# Patient Record
Sex: Male | Born: 1965 | Race: White | Hispanic: No | Marital: Married | Smoking: Never smoker
Health system: Southern US, Community
[De-identification: ages and names within clinical notes are randomized; demographics above are authoritative.]

## PROBLEM LIST (undated history)

## (undated) DIAGNOSIS — N200 Calculus of kidney: Secondary | ICD-10-CM

## (undated) HISTORY — DX: Calculus of kidney: N20.0

---

## 2000-02-18 HISTORY — PX: HERNIA REPAIR: SHX51

## 2010-02-17 HISTORY — PX: COLONOSCOPY: SHX174

## 2011-01-24 ENCOUNTER — Ambulatory Visit: Payer: Self-pay | Admitting: General Surgery

## 2011-02-14 ENCOUNTER — Ambulatory Visit: Payer: Self-pay | Admitting: General Surgery

## 2011-02-17 LAB — PATHOLOGY REPORT

## 2015-11-23 ENCOUNTER — Encounter: Payer: Self-pay | Admitting: General Surgery

## 2015-11-28 ENCOUNTER — Encounter: Payer: Self-pay | Admitting: General Surgery

## 2015-11-28 ENCOUNTER — Ambulatory Visit (INDEPENDENT_AMBULATORY_CARE_PROVIDER_SITE_OTHER): Payer: Federal, State, Local not specified - PPO | Admitting: General Surgery

## 2015-11-28 VITALS — BP 130/80 | HR 80 | Resp 12 | Ht 70.0 in | Wt 160.0 lb

## 2015-11-28 DIAGNOSIS — K4090 Unilateral inguinal hernia, without obstruction or gangrene, not specified as recurrent: Secondary | ICD-10-CM | POA: Diagnosis not present

## 2015-11-28 DIAGNOSIS — R1032 Left lower quadrant pain: Secondary | ICD-10-CM

## 2015-11-28 NOTE — Patient Instructions (Signed)
Follow up after CT scan

## 2015-11-28 NOTE — Progress Notes (Signed)
Patient ID: Erik Yu., male   DOB: January 16, 1966, 50 y.o.   MRN: [id number]  Chief Complaint  Patient presents with  . Other    HPI Erik Yu. is a 50 y.o. male here today for a evaluation of a hernia. He states he has been hurting in the left groin for 3 weeks.He does hurt lower in the groin.  Described as a throbbing pain. He does a lot of heavy lifting at work. I have reviewed the history of present illness with the patient.  HPI  History reviewed. No pertinent past medical history.  Past Surgical History:  Procedure Laterality Date  . COLONOSCOPY  2012  . HERNIA REPAIR Right 2002   Dr Jamal Collin    Family History  Problem Relation Age of Onset  . Cancer Mother     breast/bone    Social History Social History  Substance Use Topics  . Smoking status: Never Smoker  . Smokeless tobacco: Never Used  . Alcohol use No    No Known Allergies  No current outpatient prescriptions on file.   No current facility-administered medications for this visit.     Review of Systems Review of Systems  Constitutional: Negative.   Respiratory: Negative.   Cardiovascular: Negative.     Blood pressure 130/80, pulse 80, resp. rate 12, height 5' 10"  (1.778 m), weight 160 lb (72.6 kg).  Physical Exam Physical Exam  Constitutional: He is oriented to person, place, and time. He appears well-developed and well-nourished.  HENT:  Mouth/Throat: Oropharynx is clear and moist.  Eyes: Conjunctivae are normal. No scleral icterus.  Neck: Neck supple.  Cardiovascular: Normal rate, regular rhythm and normal heart sounds.   Pulmonary/Chest: Effort normal and breath sounds normal.  Abdominal: Soft. Normal appearance and bowel sounds are normal. A hernia (Small left inguinal) is present.  Lymphadenopathy:    He has no cervical adenopathy.  Neurological: He is alert and oriented to person, place, and time.  Skin: Skin is warm and dry.  Psychiatric: His behavior is normal.     Data Reviewed Notes reviewed   Assessment    Left groin pain Small left inguinal hernia. This hernia is essentially unchanged from over a yr ago. CT may help given the pain he has noted of recent  Plan    Follow up after CT scan of abdomen.  Patient has been scheduled for a CT abdomen/pelvis with contrast at [facility] for 12-07-15 at 8 am (arrive 7:45 am). Prep: NPO 4 hours prior and pick up prep kit. Patient verbalizes understanding.      This information has been scribed by Karie Fetch RN, BSN,BC. Marland Kitchen   SANKAR,SEEPLAPUTHUR G 12/03/2015, 11:14 AM

## 2015-12-03 ENCOUNTER — Encounter: Payer: Self-pay | Admitting: General Surgery

## 2015-12-07 ENCOUNTER — Ambulatory Visit
Admission: RE | Admit: 2015-12-07 | Discharge: 2015-12-07 | Disposition: A | Discharge status: Home / Self Care | Payer: Federal, State, Local not specified - PPO | Source: Ambulatory Visit | Attending: General Surgery | Admitting: General Surgery

## 2015-12-07 DIAGNOSIS — K760 Fatty (change of) liver, not elsewhere classified: Secondary | ICD-10-CM | POA: Insufficient documentation

## 2015-12-07 DIAGNOSIS — R1032 Left lower quadrant pain: Secondary | ICD-10-CM | POA: Insufficient documentation

## 2015-12-07 MED ORDER — IOPAMIDOL (ISOVUE-300) INJECTION 61%
100.0000 mL | Freq: Once | INTRAVENOUS | Status: AC | PRN
  Administered 2015-12-07: 100 mL via INTRAVENOUS

## 2015-12-10 ENCOUNTER — Ambulatory Visit (INDEPENDENT_AMBULATORY_CARE_PROVIDER_SITE_OTHER): Payer: Federal, State, Local not specified - PPO | Admitting: General Surgery

## 2015-12-10 ENCOUNTER — Encounter: Payer: Self-pay | Admitting: General Surgery

## 2015-12-10 VITALS — BP 120/70 | HR 55 | Resp 12 | Ht 70.0 in | Wt 162.0 lb

## 2015-12-10 DIAGNOSIS — R1032 Left lower quadrant pain: Secondary | ICD-10-CM

## 2015-12-10 DIAGNOSIS — N50812 Left testicular pain: Secondary | ICD-10-CM

## 2015-12-10 NOTE — Progress Notes (Signed)
Patient ID: Erik Mooshomas R Fleisher Jr., male   DOB: 1965-05-10, 50 y.o.   MRN: [id number]  Chief Complaint  Patient presents with  . Follow-up    left groin pain  . Colonoscopy    HPI Erik Mooshomas R Rehman Jr. is a 50 y.o. male here for a follow up of left groin pain. He had a CT completed on 12/07/15.  He reports that the pain has improved, though it is still there. He has been taking Aleve twice daily.  I have reviewed the history of present illness with the patient.   HPI  History reviewed. No pertinent past medical history.  Past Surgical History:  Procedure Laterality Date  . COLONOSCOPY  2012  . HERNIA REPAIR Right 2002   Dr Evette CristalSankar    Family History  Problem Relation Age of Onset  . Cancer Mother     breast/bone    Social History Social History  Substance Use Topics  . Smoking status: Never Smoker  . Smokeless tobacco: Never Used  . Alcohol use No    No Known Allergies  No current outpatient prescriptions on file.   No current facility-administered medications for this visit.     Review of Systems Review of Systems  Constitutional: Negative.   Respiratory: Negative.   Cardiovascular: Negative.   Gastrointestinal: Negative.     Blood pressure 120/70, pulse (!) 55, resp. rate 12, height 5\' 10"  (1.778 m), weight 162 lb (73.5 kg).  Physical Exam Physical Exam Not performed today Data Reviewed CT scan reviewed with pt. Very small fat containing hernia in left groin. Also noted mild bladder wall thickening- of uncertain significance.  Assessment    The small hernia is not likely causing his pain. Pain seems more down the scrotal area than groin. May benefit with urology eval. Pt agreeable.    Plan    Patient has been scheduled for an appointment [facility] for 12-26-15 at 3:15 pm.     This has been scribed by Sinda Duaryl-Lyn M Kennedy LPN    [facility],SEEPLAPUTHUR G 12/17/2015, 1:04 PM

## 2015-12-10 NOTE — Patient Instructions (Signed)
Follow up with Urology

## 2015-12-17 ENCOUNTER — Encounter: Payer: Self-pay | Admitting: General Surgery

## 2015-12-25 ENCOUNTER — Ambulatory Visit: Payer: Self-pay

## 2015-12-26 ENCOUNTER — Encounter: Payer: Self-pay | Admitting: Urology

## 2015-12-26 ENCOUNTER — Ambulatory Visit (INDEPENDENT_AMBULATORY_CARE_PROVIDER_SITE_OTHER): Payer: Federal, State, Local not specified - PPO | Admitting: Urology

## 2015-12-26 VITALS — BP 121/76 | HR 52 | Ht 70.0 in | Wt 163.8 lb

## 2015-12-26 DIAGNOSIS — N419 Inflammatory disease of prostate, unspecified: Secondary | ICD-10-CM

## 2015-12-26 DIAGNOSIS — R300 Dysuria: Secondary | ICD-10-CM

## 2015-12-26 DIAGNOSIS — N503 Cyst of epididymis: Secondary | ICD-10-CM

## 2015-12-26 DIAGNOSIS — R350 Frequency of micturition: Secondary | ICD-10-CM | POA: Diagnosis not present

## 2015-12-26 LAB — URINALYSIS, COMPLETE
BILIRUBIN UA: NEGATIVE
Glucose, UA: NEGATIVE
KETONES UA: NEGATIVE
Leukocytes, UA: NEGATIVE
Nitrite, UA: NEGATIVE
PH UA: 5 (ref 5.0–7.5)
PROTEIN UA: NEGATIVE
RBC UA: NEGATIVE
UUROB: 0.2 mg/dL (ref 0.2–1.0)

## 2015-12-26 LAB — BLADDER SCAN AMB NON-IMAGING: SCAN RESULT: 52

## 2015-12-26 LAB — MICROSCOPIC EXAMINATION
BACTERIA UA: NONE SEEN
Epithelial Cells (non renal): NONE SEEN /hpf (ref 0–10)
WBC, UA: NONE SEEN /hpf (ref 0–?)

## 2015-12-26 MED ORDER — CIPROFLOXACIN HCL 500 MG PO TABS: 500.0000 mg | ORAL_TABLET | Freq: Two times a day (BID) | ORAL | 0 refills | Status: DC

## 2015-12-26 MED ORDER — TAMSULOSIN HCL 0.4 MG PO CAPS: 0.4000 mg | ORAL_CAPSULE | Freq: Every day | ORAL | 11 refills | Status: DC

## 2015-12-26 NOTE — Progress Notes (Signed)
12/26/2015 4:14 PM   Erik Pencehomas R Metts Jr. [birth year] [id number]  Referring provider: No referring provider defined for this encounter.  Chief Complaint  Patient presents with  . New Patient (Initial Visit)    dysuria, urinary frequency     HPI:   50 year old male referred by Dr.Sankar for further evaluation of left groin pain. He was initially seen for possible evaluation of a left inguinal hernia. He had a CT completed on 02/06/2016 which showed a very small fat-containing inguinal hernia which was not thought to be the source of his discomfort.  He does have a history of right inguinal hernia s/p repair ~ 10 years ago.  He reports that he developed left groin pain radiating to the base of his penis and his left testicle which lasted for at least 2 weeks and slowly lessened. He does still have some residual discomfort. He does have some mild dysuria at the tip of his penis when voiding but not consistently.  He also has mildly increased urinary frequency and urgency which is different from his baseline. He notes that his pain worsens with constipation and difficulty evacuating his stool. He's never previously had this issue. It has improved since starting a course of NSAIDs recommended by Dr. Evette CristalSankar.  His wife works at the [employer] and checked a urinalysis which was negative.  PVR today 52.  UA today negative.    He did have his PSA 6 months ago and recalls that it was low.     PMH: No past medical history on file.  Surgical History: Past Surgical History:  Procedure Laterality Date  . COLONOSCOPY  2012  . HERNIA REPAIR Right 2002   Dr Evette CristalSankar    Home Medications:    Medication List       Accurate as of 12/26/15  4:14 PM. Always use your most recent med list.          ciprofloxacin 500 MG tablet Commonly known as:  CIPRO Take 1 tablet (500 mg total) by mouth 2 (two) times daily.       Allergies: No Known Allergies  Family History: Family History  Problem  Relation Age of Onset  . Cancer Mother     breast/bone  . Prostate cancer Neg Hx   . Kidney cancer Neg Hx   . Sickle cell anemia Neg Hx     Social History:  reports that he has never smoked. He has never used smokeless tobacco. He reports that he does not drink alcohol or use drugs.  ROS: UROLOGY Frequent Urination?: Yes Hard to postpone urination?: No Burning/pain with urination?: Yes Get up at night to urinate?: No Leakage of urine?: No Urine stream starts and stops?: No Trouble starting stream?: No Do you have to strain to urinate?: No Blood in urine?: No Urinary tract infection?: No Sexually transmitted disease?: No Injury to kidneys or bladder?: No Painful intercourse?: No Weak stream?: No Erection problems?: No Penile pain?: Yes  Gastrointestinal Nausea?: No Vomiting?: No Indigestion/heartburn?: No Diarrhea?: No Constipation?: No  Constitutional Fever: No Night sweats?: No Weight loss?: No Fatigue?: No  Skin Skin rash/lesions?: No Itching?: No  Eyes Blurred vision?: No Double vision?: No  Ears/Nose/Throat Sore throat?: No Sinus problems?: No  Hematologic/Lymphatic Swollen glands?: No Easy bruising?: No  Cardiovascular Leg swelling?: No Chest pain?: No  Respiratory Cough?: No Shortness of breath?: No  Endocrine Excessive thirst?: No  Musculoskeletal Back pain?: No Joint pain?: No  Neurological Headaches?: No Dizziness?: No  Psychologic Depression?:  No Anxiety?: No  Physical Exam: BP 121/76   Pulse (!) 52   Ht 5\' 10"  (1.778 m)   Wt 163 lb 12.8 oz (74.3 kg)   BMI 23.50 kg/m   Constitutional:  Alert and oriented, No acute distress. HEENT: [place] AT, moist mucus membranes.  Trachea midline, no masses. Cardiovascular: No clubbing, cyanosis, or edema. Respiratory: Normal respiratory effort, no increased work of breathing. GI: Abdomen is soft, nontender, nondistended, no abdominal masses.  Slight bulge with Valsalva in the left  inguinal area. GU: Circumcised phallus with orthotopic meatus. No penile lesions. Bilateral testicles descended. 1 cm left epididymal cyst appreciated, nontender. No testicular tenderness or masses.  Rectal: Normal sphincter tone. Mildly tender 40 cc prostate, non-boggy without fluctuance, no nodules.  Each and states that rectal exam reproduces some of his discomfort is experiencing. Skin: No rashes, bruises or suspicious lesions. Lymph: No inguinal adenopathy. Neurologic: Grossly intact, no focal deficits, moving all 4 extremities. Psychiatric: Normal mood and affect.  Laboratory Data: Will fax [place] a PSA next week  Urinalysis UA today completely negative, see EPIC  Results for orders placed or performed in visit on 12/26/15  Microscopic Examination  Result Value Ref Range   WBC, UA None seen 0 - 5 /hpf   RBC, UA 0-2 0 - 2 /hpf   Epithelial Cells (non renal) None seen 0 - 10 /hpf   Bacteria, UA None seen None seen/Few  Urinalysis, Complete  Result Value Ref Range   Specific Gravity, UA >1.030 (H) 1.005 - 1.030   pH, UA 5.0 5.0 - 7.5   Color, UA Yellow Yellow   Appearance Ur Clear Clear   Leukocytes, UA Negative Negative   Protein, UA Negative Negative/Trace   Glucose, UA Negative Negative   Ketones, UA Negative Negative   RBC, UA Negative Negative   Bilirubin, UA Negative Negative   Urobilinogen, Ur 0.2 0.2 - 1.0 mg/dL   Nitrite, UA Negative Negative   Microscopic Examination See below:   Bladder Scan (Post Void Residual) in office  Result Value Ref Range   Scan Result 52      Pertinent Imaging: CLINICAL DATA:  Abdominal pain radiating to left inguinal region. Frequent urination  EXAM: CT ABDOMEN AND PELVIS WITH CONTRAST  TECHNIQUE: Multidetector CT imaging of the abdomen and pelvis was performed using the standard protocol following bolus administration of intravenous contrast. Oral contrast was also administered.  CONTRAST:  ISOVUE-300 IOPAMIDOL  (ISOVUE-300) INJECTION 61%  COMPARISON:  January 24, 2011  FINDINGS: Lower chest: Lung bases are clear.  Hepatobiliary: There is hepatic steatosis. No focal liver lesions are evident. Gallbladder wall is not appreciably thickened. There is no biliary duct dilatation.  Pancreas: There is no evident pancreatic mass or inflammatory focus.  Spleen: No splenic lesions are appreciable.  Adrenals/Urinary Tract: Adrenals appear unremarkable bilaterally. There is a cyst in the anterior mid left kidney measuring 1.5 x 1.3 cm. There is no hydronephrosis on either side. There is no renal or ureteral calculus on either side. Urinary bladder is midline with borderline thickening of the urinary bladder wall.  Stomach/Bowel: There are multiple sigmoid diverticula without demonstrable diverticulitis. There is no appreciable bowel wall or mesenteric thickening. There is no bowel obstruction. No free air or portal venous air.  Vascular/Lymphatic: There is no abdominal aortic aneurysm. Major mesenteric vessels appear patent. A small amount of atherosclerotic plaque is noted in the distal aorta. There is no adenopathy in the abdomen or pelvis.  Reproductive: Prostate and seminal  vesicles appear normal in size and contour. There is no pelvic mass or pelvic fluid collection.  Other: Appendix appears unremarkable. There is no ascites or abscess in the abdomen or pelvis. Patient has had right inguinal hernia repair. There is fat in the left inguinal ring.  Musculoskeletal: There are no blastic or lytic bone lesions. No intramuscular or abdominal wall lesion.  IMPRESSION: Borderline urinary bladder wall thickening. Question a degree of cystitis.  No renal or ureteral calculus.  No hydronephrosis.  No bowel obstruction. No bowel wall thickening. No abscess. Appendix appears normal.  Patient has had repair of right inguinal hernia. There is fat in the left inguinal  ring.  There is hepatic steatosis.  No focal liver lesions evident.   Electronically Signed   By: Bretta BangWilliam  Woodruff III M.D.   On: 12/07/2015 09:32  CT scan reviewed personally today  Assessment & Plan:   50 year old male with left inguinal discomfort radiating to the base) penis, left testicle, reproducible on palpation of the prostate today. No evidence of obvious urinary tract infection, UA negative. Adequate bladder emptying appreciated. Symptoms improving with NSAIDs not completely resolved.  I agree with Dr. Luan MooreSankar's assessment that his pain does not seem to be associated with small fat containing inguinal hernia  1. Prostatitis, unspecified prostatitis type Suspect low-grade prostatitis with ongoing chronic inflammation possibly contributing to his symptoms Continue NSAIDs as tolerated We'll prescribe a relatively short course of Cipro, and 2 weeks Black box warnings for Cipro described, avoid strenuous activity while taking this medication Patient will call us and let us know if his symptoms fail to resolve after 2 weeks, may consider extending the course for an additional 2 weeks thereafter PSA screening recommended, he will fax us his most recent PSA next week  2. Urinary frequency Likely secondary to above Return to care fails to improve with above treatment - Urinalysis, Complete - Bladder Scan (Post Void Residual) in office  3. Dysuria No evidence of active UTI, suspect secondary to prostatitis - Urinalysis, Complete - Bladder Scan (Post Void Residual) in office  4. Epididymal cyst Incidental 1 cm epididymal cyst, no further intervention or imaging recommended  Return if symptoms worsen or fail to improve.  Vanna ScotlandAshley Bonny Egger, MD  [facility] [address], [address] [place], [place] [address] [phone]

## 2016-01-08 ENCOUNTER — Telehealth: Payer: Self-pay

## 2016-01-08 DIAGNOSIS — R102 Pelvic and perineal pain: Secondary | ICD-10-CM

## 2016-01-08 MED ORDER — CIPROFLOXACIN HCL 500 MG PO TABS: 500.0000 mg | ORAL_TABLET | Freq: Two times a day (BID) | ORAL | 0 refills | Status: DC

## 2016-01-08 NOTE — Telephone Encounter (Signed)
Spoke with pt in reference to cipro. Made aware if s/s dont improve will need OV to discuss further options. Pt voiced understanding.

## 2016-01-08 NOTE — Telephone Encounter (Signed)
Yes, please give 2 more weeks of cipro.  If not improvement thereafter, needs f/u apt to discuss.    Vanna ScotlandAshley Rushawn Capshaw, MD

## 2016-01-08 NOTE — Telephone Encounter (Signed)
Pt called back stating that he is continuing to have pain and requested more abx. Read dictation. Just wanted to follow up prior to abx given.

## 2016-01-17 ENCOUNTER — Encounter: Payer: Self-pay | Admitting: *Deleted

## 2016-01-17 ENCOUNTER — Encounter: Payer: Self-pay | Admitting: General Surgery

## 2016-01-29 ENCOUNTER — Ambulatory Visit (INDEPENDENT_AMBULATORY_CARE_PROVIDER_SITE_OTHER): Payer: Federal, State, Local not specified - PPO

## 2016-01-29 VITALS — BP 147/95 | HR 73 | Ht 70.0 in | Wt 162.1 lb

## 2016-01-29 DIAGNOSIS — R300 Dysuria: Secondary | ICD-10-CM | POA: Diagnosis not present

## 2016-01-29 NOTE — Progress Notes (Signed)
Pt presented today with c/o dysuria and lower abd pain. Pt has been on cipro in the last 30 days. A clean catch specimen was obtained for u/a and cx.    Blood pressure (!) 147/95, pulse 73, height 5\' 10"  (1.778 m), weight 162 lb 1.6 oz (73.5 kg).

## 2016-01-30 ENCOUNTER — Telehealth: Payer: Self-pay

## 2016-01-30 LAB — URINALYSIS, COMPLETE
BILIRUBIN UA: NEGATIVE
GLUCOSE, UA: NEGATIVE
KETONES UA: NEGATIVE
Leukocytes, UA: NEGATIVE
NITRITE UA: NEGATIVE
UUROB: 0.2 mg/dL (ref 0.2–1.0)
pH, UA: 5 (ref 5.0–7.5)

## 2016-01-30 LAB — MICROSCOPIC EXAMINATION
Bacteria, UA: NONE SEEN
WBC UA: NONE SEEN /HPF (ref 0–?)

## 2016-01-30 NOTE — Telephone Encounter (Signed)
-----   Message from Vanna ScotlandAshley Brandon, MD sent at 01/30/2016  1:06 PM EST ----- Does not appear to be infected. He does have microscopic blood in his urine. If his symptoms fail to improve, please have him make another follow-up appointment with [facility].  Vanna ScotlandAshley Brandon, MD

## 2016-01-30 NOTE — Telephone Encounter (Signed)
Spoke with pt in reference to u/a results. Pt stated that today his symptoms have been almost unbearable. Pt elected to go ahead and make a f/u appt and will cancel if not needed.

## 2016-01-31 LAB — CULTURE, URINE COMPREHENSIVE

## 2016-03-04 ENCOUNTER — Encounter: Payer: Self-pay | Admitting: Emergency Medicine

## 2016-03-04 ENCOUNTER — Emergency Department: Payer: Federal, State, Local not specified - PPO

## 2016-03-04 ENCOUNTER — Emergency Department
Admission: EM | Admit: 2016-03-04 | Discharge: 2016-03-04 | Disposition: A | Discharge status: Home / Self Care | Payer: Federal, State, Local not specified - PPO | Attending: Emergency Medicine | Admitting: Emergency Medicine

## 2016-03-04 DIAGNOSIS — Z79899 Other long term (current) drug therapy: Secondary | ICD-10-CM | POA: Insufficient documentation

## 2016-03-04 DIAGNOSIS — N50812 Left testicular pain: Secondary | ICD-10-CM

## 2016-03-04 DIAGNOSIS — N200 Calculus of kidney: Secondary | ICD-10-CM | POA: Diagnosis not present

## 2016-03-04 DIAGNOSIS — R1032 Left lower quadrant pain: Secondary | ICD-10-CM | POA: Diagnosis present

## 2016-03-04 DIAGNOSIS — N50819 Testicular pain, unspecified: Secondary | ICD-10-CM | POA: Diagnosis not present

## 2016-03-04 LAB — COMPREHENSIVE METABOLIC PANEL
ALT: 19 U/L (ref 17–63)
ANION GAP: 10 (ref 5–15)
AST: 26 U/L (ref 15–41)
Albumin: 4.4 g/dL (ref 3.5–5.0)
Alkaline Phosphatase: 67 U/L (ref 38–126)
BUN: 27 mg/dL — ABNORMAL HIGH (ref 6–20)
CALCIUM: 9.4 mg/dL (ref 8.9–10.3)
CHLORIDE: 104 mmol/L (ref 101–111)
CO2: 27 mmol/L (ref 22–32)
CREATININE: 1.48 mg/dL — AB (ref 0.61–1.24)
GFR, EST NON AFRICAN AMERICAN: 53 mL/min — AB (ref >60)
Glucose, Bld: 174 mg/dL — ABNORMAL HIGH (ref 65–99)
Potassium: 3.5 mmol/L (ref 3.5–5.1)
SODIUM: 141 mmol/L (ref 135–145)
Total Bilirubin: 0.8 mg/dL (ref 0.3–1.2)
Total Protein: 7.4 g/dL (ref 6.5–8.1)

## 2016-03-04 LAB — CBC
HCT: 43.1 % (ref 40.0–52.0)
HEMOGLOBIN: 15.2 g/dL (ref 13.0–18.0)
MCH: 32 pg (ref 26.0–34.0)
MCHC: 35.3 g/dL (ref 32.0–36.0)
MCV: 90.7 fL (ref 80.0–100.0)
Platelets: 225 10*3/uL (ref 150–440)
RBC: 4.75 MIL/uL (ref 4.40–5.90)
RDW: 12.7 % (ref 11.5–14.5)
WBC: 12.7 10*3/uL — AB (ref 3.8–10.6)

## 2016-03-04 LAB — URINALYSIS, COMPLETE (UACMP) WITH MICROSCOPIC
BILIRUBIN URINE: NEGATIVE
Bacteria, UA: NONE SEEN
GLUCOSE, UA: 50 mg/dL — AB
Ketones, ur: 5 mg/dL — AB
LEUKOCYTES UA: NEGATIVE
Nitrite: NEGATIVE
PH: 5 (ref 5.0–8.0)
Protein, ur: NEGATIVE mg/dL
SPECIFIC GRAVITY, URINE: 1.021 (ref 1.005–1.030)
Squamous Epithelial / LPF: NONE SEEN

## 2016-03-04 LAB — LIPASE, BLOOD: LIPASE: 18 U/L (ref 11–51)

## 2016-03-04 MED ORDER — ONDANSETRON 4 MG PO TBDP: 4.0000 mg | ORAL_TABLET | Freq: Three times a day (TID) | ORAL | 0 refills | Status: DC | PRN

## 2016-03-04 MED ORDER — MORPHINE SULFATE (PF) 4 MG/ML IV SOLN: 4.0000 mg | Freq: Once | INTRAVENOUS | Status: DC

## 2016-03-04 MED ORDER — SODIUM CHLORIDE 0.9 % IV BOLUS (SEPSIS)
1000.0000 mL | Freq: Once | INTRAVENOUS | Status: AC
  Administered 2016-03-04: 1000 mL via INTRAVENOUS

## 2016-03-04 MED ORDER — ONDANSETRON HCL 4 MG/2ML IJ SOLN
INTRAMUSCULAR | Status: AC
  Administered 2016-03-04: 4 mg via INTRAVENOUS
  Filled 2016-03-04: qty 2

## 2016-03-04 MED ORDER — MORPHINE SULFATE (PF) 4 MG/ML IV SOLN
4.0000 mg | Freq: Once | INTRAVENOUS | Status: AC
  Administered 2016-03-04: 4 mg via INTRAVENOUS

## 2016-03-04 MED ORDER — ONDANSETRON HCL 4 MG/2ML IJ SOLN
4.0000 mg | Freq: Once | INTRAMUSCULAR | Status: AC
  Administered 2016-03-04: 4 mg via INTRAVENOUS

## 2016-03-04 MED ORDER — MORPHINE SULFATE (PF) 4 MG/ML IV SOLN
INTRAVENOUS | Status: AC
  Administered 2016-03-04: 4 mg via INTRAVENOUS
  Filled 2016-03-04: qty 1

## 2016-03-04 MED ORDER — ONDANSETRON HCL 4 MG/2ML IJ SOLN
INTRAMUSCULAR | Status: AC
  Filled 2016-03-04: qty 2

## 2016-03-04 MED ORDER — HYDROMORPHONE HCL 1 MG/ML IJ SOLN
INTRAMUSCULAR | Status: AC
  Filled 2016-03-04: qty 1

## 2016-03-04 MED ORDER — OXYCODONE-ACETAMINOPHEN 5-325 MG PO TABS: 1.0000 | ORAL_TABLET | ORAL | 0 refills | Status: DC | PRN

## 2016-03-04 MED ORDER — HYDROMORPHONE HCL 1 MG/ML IJ SOLN
1.0000 mg | Freq: Once | INTRAMUSCULAR | Status: AC
  Administered 2016-03-04: 1 mg via INTRAVENOUS

## 2016-03-04 NOTE — ED Provider Notes (Signed)
[facility] Emergency Department Provider Note   First MD Initiated Contact with Patient 03/04/16 0500     (approximate)  I have reviewed the triage vital signs and the nursing notes.   HISTORY  Chief Complaint Groin Pain    HPI Erik Mooshomas R Magid Jr. is a 51 y.o. male presents with acute onset of left groin/scrotal pain at 1 AM this morning accompanied by nausea. Patient said he had a history of the same in November and was diagnosed with prostatitis at that time by Dr. Apolinar JunesBrandon urologist. Patient states his current pain score is 10 out of 10. Patient denies any fever or febrile on presentation temperature 97.4.   Past medical history Prostatitis There are no active problems to display for this patient.   Past Surgical History:  Procedure Laterality Date  . COLONOSCOPY  2012  . HERNIA REPAIR Right 2002   Dr Evette CristalSankar    Prior to Admission medications   Medication Sig Start Date End Date Taking? Authorizing Provider  ciprofloxacin (CIPRO) 500 MG tablet Take 1 tablet (500 mg total) by mouth 2 (two) times daily. 01/08/16   Vanna ScotlandAshley Brandon, MD  tamsulosin (FLOMAX) 0.4 MG CAPS capsule Take 1 capsule (0.4 mg total) by mouth daily. 12/26/15   Vanna ScotlandAshley Brandon, MD    Allergies No known drug allergies  Family History  Problem Relation Age of Onset  . Cancer Mother     breast/bone  . Prostate cancer Neg Hx   . Kidney cancer Neg Hx   . Sickle cell anemia Neg Hx     Social History Social History  Substance Use Topics  . Smoking status: Never Smoker  . Smokeless tobacco: Never Used  . Alcohol use No    Review of Systems Constitutional: No fever/chills Eyes: No visual changes. ENT: No sore throat. Cardiovascular: Denies chest pain. Respiratory: Denies shortness of breath. Gastrointestinal:Positive for left groin pain.  No nausea, no vomiting.  No diarrhea.  No constipation. Genitourinary: Negative for dysuria.Positive for left testicular  pain Musculoskeletal: Negative for back pain. Skin: Negative for rash. Neurological: Negative for headaches, focal weakness or numbness.  10-point ROS otherwise negative.  ____________________________________________   PHYSICAL EXAM:  VITAL SIGNS: ED Triage Vitals  Enc Vitals Group     BP 03/04/16 0451 133/90     Pulse Rate 03/04/16 0451 (!) 56     Resp 03/04/16 0451 20     Temp 03/04/16 0451 97.4 F (36.3 C)     Temp Source 03/04/16 0451 Oral     SpO2 03/04/16 0451 100 %     Weight 03/04/16 0452 160 lb (72.6 kg)     Height 03/04/16 0452 5\' 10"  (1.778 m)     Head Circumference --      Peak Flow --      Pain Score 03/04/16 0452 10     Pain Loc --      Pain Edu? --      Excl. in GC? --     Constitutional: Alert and oriented.Apparent discomfort  Eyes: Conjunctivae are normal. PERRL. EOMI. Head: Atraumatic. Mouth/Throat: Mucous membranes are moist.  Oropharynx non-erythematous. Neck: No stridor.   Cardiovascular: Normal rate, regular rhythm. Good peripheral circulation. Grossly normal heart sounds. Respiratory: Normal respiratory effort.  No retractions. Lungs CTAB. Gastrointestinal: Soft and nontender. No distention.  Musculoskeletal: No lower extremity tenderness nor edema. No gross deformities of extremities. Neurologic:  Normal speech and language. No gross focal neurologic deficits are appreciated.  Skin:  Skin  is warm, dry and intact. No rash noted.   ____________________________________________   LABS (all labs ordered are listed, but only abnormal results are displayed)  Labs Reviewed  COMPREHENSIVE METABOLIC PANEL - Abnormal; Notable for the following:       Result Value   Glucose, Bld 174 (*)    BUN 27 (*)    Creatinine, Ser 1.48 (*)    GFR calc non Af Amer 53 (*)    All other components within normal limits  CBC - Abnormal; Notable for the following:    WBC 12.7 (*)    All other components within normal limits  LIPASE, BLOOD  URINALYSIS, COMPLETE  (UACMP) WITH MICROSCOPIC    RADIOLOGY I, Leith N Maxtyn Nuzum, personally viewed and evaluated these images (plain radiographs) as part of my medical decision making, as well as reviewing the written report by the radiologist.  US Scrotum  Result Date: 03/04/2016 CLINICAL DATA:  Left testicular pain, onset 5 hours prior. EXAM: SCROTAL ULTRASOUND DOPPLER ULTRASOUND OF THE TESTICLES TECHNIQUE: Complete ultrasound examination of the testicles, epididymis, and other scrotal structures was performed. Color and spectral Doppler ultrasound were also utilized to evaluate blood flow to the testicles. COMPARISON:  None. FINDINGS: Right testicle Measurements: 4.5 x 2.1 x 2.8 cm. No mass or microlithiasis visualized. Normal blood flow. Left testicle Measurements: 4.2 x 2.1 x 2.6 cm. No mass or microlithiasis visualized. Normal blood flow. Right epididymis:  Normal in size and appearance. Left epididymis: 2 epididymal head cysts, largest measuring 1 cm. No hyperemia. Hydrocele:  None visualized. Varicocele: Borderline bilaterally measuring 2.6 mm on the right and 2.5 mm on the left. Pulsed Doppler interrogation of both testes demonstrates normal low resistance arterial and venous waveforms bilaterally. IMPRESSION: 1. No acute abnormality.  No testicular torsion. 2. Incidental left epididymal head cysts. 3. Borderline bilateral varicoceles. Electronically Signed   By: Rubye Oaks M.D.   On: 03/04/2016 06:12   [place] Art/ven Flow Abd Pelv Doppler  Result Date: 03/04/2016 CLINICAL DATA:  Left testicular pain, onset 5 hours prior. EXAM: SCROTAL ULTRASOUND DOPPLER ULTRASOUND OF THE TESTICLES TECHNIQUE: Complete ultrasound examination of the testicles, epididymis, and other scrotal structures was performed. Color and spectral Doppler ultrasound were also utilized to evaluate blood flow to the testicles. COMPARISON:  None. FINDINGS: Right testicle Measurements: 4.5 x 2.1 x 2.8 cm. No mass or microlithiasis visualized. Normal  blood flow. Left testicle Measurements: 4.2 x 2.1 x 2.6 cm. No mass or microlithiasis visualized. Normal blood flow. Right epididymis:  Normal in size and appearance. Left epididymis: 2 epididymal head cysts, largest measuring 1 cm. No hyperemia. Hydrocele:  None visualized. Varicocele: Borderline bilaterally measuring 2.6 mm on the right and 2.5 mm on the left. Pulsed Doppler interrogation of both testes demonstrates normal low resistance arterial and venous waveforms bilaterally. IMPRESSION: 1. No acute abnormality.  No testicular torsion. 2. Incidental left epididymal head cysts. 3. Borderline bilateral varicoceles. Electronically Signed   By: Rubye Oaks M.D.   On: 03/04/2016 06:12   Ct Renal Stone Study  Result Date: 03/04/2016 CLINICAL DATA:  Left flank pain. EXAM: CT ABDOMEN AND PELVIS WITHOUT CONTRAST TECHNIQUE: Multidetector CT imaging of the abdomen and pelvis was performed following the standard protocol without IV contrast. COMPARISON:  CT 12/07/2015 FINDINGS: Lower chest: The lung bases are clear.  No pleural fluid. Hepatobiliary: No focal lesion allowing for lack contrast. Gallbladder physiologically distended, no calcified stone. No biliary dilatation. Pancreas: No ductal dilatation or inflammation. Spleen: Upper limits normal in size  measuring 12.3 cm. No focal abnormality. Adrenals/Urinary Tract: Obstructing 7 x 3 mm stone in the distal left ureter just proximal to the ureterovesicular junction with moderate hydroureteronephrosis. This stone may have been present on the prior exam, however was not causing obstruction at that time. Mild perinephric edema. No definite additional nonobstructing stones in either kidney. No right hydronephrosis. Simple cyst in the upper left kidney. Urinary bladder is minimally distended, questionable bladder wall thickening again seen. Normal adrenal glands. Stomach/Bowel: Small hiatal hernia. Stomach physiologically distended. No bowel wall thickening,  inflammation or abnormal distention. Normal appendix. Vascular/Lymphatic: No significant vascular findings are present. No enlarged abdominal or pelvic lymph nodes. Reproductive: Prostate is unremarkable. Other: Post right inguinal hernia repair. Fat containing left inguinal hernia. Tiny fat containing umbilical hernia. No free air or free fluid. Musculoskeletal: Stable degenerative change in the spine. There are no acute or suspicious osseous abnormalities. IMPRESSION: Obstructing 7 x 3 mm left distal ureteric stone with moderate left hydronephrosis andmild perinephric edema. Electronically Signed   By: Rubye Oaks M.D.   On: 03/04/2016 06:37     Procedures     INITIAL IMPRESSION / ASSESSMENT AND PLAN / ED COURSE  Pertinent labs & imaging results that were available during my care of the patient were reviewed by me and considered in my medical decision making (see chart for details).  Patient history of physical exam concerning for possible ureterolithiasis however given testicular pain concern for possible testicular torsion as such ultrasound was performed which revealed no evidence of testicular torsion. CT scan of the abdomen and pelvis revealed a 7 x 3 mm obstructing distal ureteral stone.   Clinical Course     ____________________________________________  FINAL CLINICAL IMPRESSION(S) / ED DIAGNOSES  Final diagnoses:  Pain in left testicle  Kidney stone on left side     MEDICATIONS GIVEN DURING THIS VISIT:  Medications  ondansetron (ZOFRAN) injection 4 mg (not administered)  ondansetron (ZOFRAN) 4 MG/2ML injection (not administered)  morphine 4 MG/ML injection 4 mg (4 mg Intravenous Given 03/04/16 0514)  ondansetron (ZOFRAN) injection 4 mg (4 mg Intravenous Given 03/04/16 0514)  sodium chloride 0.9 % bolus 1,000 mL (1,000 mLs Intravenous New Bag/Given 03/04/16 0514)  HYDROmorphone (DILAUDID) injection 1 mg (1 mg Intravenous Given 03/04/16 0607)     NEW OUTPATIENT  MEDICATIONS STARTED DURING THIS VISIT:  New Prescriptions   No medications on file    Modified Medications   No medications on file    Discontinued Medications   No medications on file     Note:  This document was prepared using Dragon voice recognition software and may include unintentional dictation errors.    Darci Current, MD 03/05/16 [phone]

## 2016-03-04 NOTE — ED Triage Notes (Signed)
Pt ambulatory to triage with steady gait with c/o LEFT groin pain since 1 am this morning accompanied by nasuea. Pt reports hx of same in Nov and dx with prostatitis. Pt reports has not been able to be seen by urologist. Pt appears in pain, moaning and groaning, unable to sit still in triage.

## 2016-03-05 ENCOUNTER — Ambulatory Visit: Payer: Federal, State, Local not specified - PPO | Admitting: Urology

## 2016-03-05 ENCOUNTER — Ambulatory Visit: Payer: Federal, State, Local not specified - PPO

## 2016-03-07 ENCOUNTER — Ambulatory Visit (INDEPENDENT_AMBULATORY_CARE_PROVIDER_SITE_OTHER): Payer: Federal, State, Local not specified - PPO | Admitting: Urology

## 2016-03-07 ENCOUNTER — Ambulatory Visit: Payer: Federal, State, Local not specified - PPO | Admitting: Urology

## 2016-03-07 ENCOUNTER — Ambulatory Visit
Admission: RE | Admit: 2016-03-07 | Discharge: 2016-03-07 | Disposition: A | Discharge status: Home / Self Care | Payer: Federal, State, Local not specified - PPO | Source: Ambulatory Visit | Attending: Urology | Admitting: Urology

## 2016-03-07 ENCOUNTER — Encounter: Payer: Self-pay | Admitting: Urology

## 2016-03-07 VITALS — BP 131/77 | HR 80 | Ht 70.0 in | Wt 160.0 lb

## 2016-03-07 DIAGNOSIS — N201 Calculus of ureter: Secondary | ICD-10-CM

## 2016-03-07 NOTE — Progress Notes (Signed)
03/07/2016 3:14 PM   Erik Haring Jr. [birth year] [id number]  Referring provider: No referring provider defined for this encounter.  Chief Complaint  Patient presents with  . Nephrolithiasis    follow up    HPI:   [name] who return to the office found to have a new left distal ureteral stone s/p recent ED visit.  1- Left distal ureteral stone Colicky, severe LLQ pain on night prior Thanksgiving, Christmas eve, and 03/04/16.  Found to have 7 x 3 mm left distal ureteral stone, no seen on previous contrast study with mild hydronephrosis.  WBC 12.7.  UA negative other than blood.  Cr 1.48, baseline unknown.    No previous stone episodes.    Continues to have occasional flank pain.  No fevers, chills, n/v.  No voiding symptoms.    2- History of prostatitis Initially referred by Dr.Sankar for further evaluation of left groin pain radiating to base of penis. He was initially seen for possible evaluation of a left inguinal hernia. He had a CT completed on 12/07/2015 which showed a very small fat-containing inguinal hernia which was not thought to be the source of his discomfort.   Found to have mild dysuria, urgency, frequency, tenderness on prostate exam.  Improved with cipro x 30 days.   PMH: No past medical history on file.  Surgical History: Past Surgical History:  Procedure Laterality Date  . COLONOSCOPY  2012  . HERNIA REPAIR Right 2002   Dr Jamal Collin    Home Medications:  Allergies as of 03/07/2016   No Known Allergies     Medication List       Accurate as of 03/07/16 11:59 PM. Always use your most recent med list.          ondansetron 4 MG disintegrating tablet Commonly known as:  ZOFRAN ODT Take 1 tablet (4 mg total) by mouth every 8 (eight) hours as needed for nausea or vomiting.   oxyCODONE-acetaminophen 5-325 MG tablet Commonly known as:  ROXICET Take 1 tablet by mouth every 4 (four) hours as needed for severe pain.   tamsulosin 0.4 MG Caps  capsule Commonly known as:  FLOMAX Take 1 capsule (0.4 mg total) by mouth daily.       Allergies: No Known Allergies  Family History: Family History  Problem Relation Age of Onset  . Cancer Mother     breast/bone  . Prostate cancer Neg Hx   . Kidney cancer Neg Hx   . Sickle cell anemia Neg Hx     Social History:  reports that he has never smoked. He has never used smokeless tobacco. He reports that he does not drink alcohol or use drugs.  ROS: UROLOGY Frequent Urination?: No Hard to postpone urination?: No Burning/pain with urination?: Yes Get up at night to urinate?: No Leakage of urine?: No Urine stream starts and stops?: No Trouble starting stream?: No Do you have to strain to urinate?: No Blood in urine?: No Urinary tract infection?: No Sexually transmitted disease?: No Injury to kidneys or bladder?: No Painful intercourse?: No Weak stream?: No Erection problems?: No Penile pain?: Yes  Gastrointestinal Nausea?: No Vomiting?: No Indigestion/heartburn?: No Diarrhea?: No Constipation?: No  Constitutional Fever: No Night sweats?: No Weight loss?: No Fatigue?: No  Skin Skin rash/lesions?: No Itching?: No  Eyes Blurred vision?: No Double vision?: No  Ears/Nose/Throat Sore throat?: No Sinus problems?: No  Hematologic/Lymphatic Swollen glands?: No Easy bruising?: No  Cardiovascular Leg swelling?: No Chest pain?: No  Respiratory Cough?: No Shortness of breath?: No  Endocrine Excessive thirst?: No  Musculoskeletal Back pain?: No Joint pain?: No  Neurological Headaches?: No Dizziness?: No  Psychologic Depression?: No Anxiety?: No  Physical Exam: BP 131/77   Pulse 80   Ht 5' 10"  (1.778 m)   Wt 160 lb (72.6 kg)   BMI 22.96 kg/m   Constitutional:  Alert and oriented, No acute distress. HEENT: [place] AT, moist mucus membranes.  Trachea midline, no masses. Cardiovascular: No clubbing, cyanosis, or edema. Respiratory: Normal  respiratory effort, no increased work of breathing. GI: Abdomen is soft, nontender, nondistended, no abdominal masses.   GU: No CVA tenderness. Skin: No rashes, bruises or suspicious lesions. Neurologic: Grossly intact, no focal deficits, moving all 4 extremities. Psychiatric: Normal mood and affect.  Laboratory Data: Lab Results  Component Value Date   CREATININE 1.48 (H) 03/04/2016    Urinalysis Component     Latest Ref Rng & Units 03/04/2016  Color, Urine     YELLOW YELLOW (A)  Appearance     CLEAR CLEAR (A)  Specific Gravity, Urine     1.005 - 1.030 1.021  pH     5.0 - 8.0 5.0  Glucose     NEGATIVE mg/dL 50 (A)  Hgb urine dipstick     NEGATIVE MODERATE (A)  Bilirubin Urine     NEGATIVE NEGATIVE  Ketones, ur     NEGATIVE mg/dL 5 (A)  Protein     NEGATIVE mg/dL NEGATIVE  Nitrite     NEGATIVE NEGATIVE  Leukocytes, UA     NEGATIVE NEGATIVE  RBC / HPF     0 - 5 RBC/hpf 0-5  WBC, UA     0 - 5 WBC/hpf 0-5  Bacteria, UA     NONE SEEN NONE SEEN  Squamous Epithelial / LPF     NONE SEEN NONE SEEN  Mucous      PRESENT  Hyaline Casts, UA      PRESENT    Pertinent Imaging: CLINICAL DATA:  Left flank pain.  EXAM: CT ABDOMEN AND PELVIS WITHOUT CONTRAST  TECHNIQUE: Multidetector CT imaging of the abdomen and pelvis was performed following the standard protocol without IV contrast.  COMPARISON:  CT 12/07/2015  FINDINGS: Lower chest: The lung bases are clear.  No pleural fluid.  Hepatobiliary: No focal lesion allowing for lack contrast. Gallbladder physiologically distended, no calcified stone. No biliary dilatation.  Pancreas: No ductal dilatation or inflammation.  Spleen: Upper limits normal in size measuring 12.3 cm. No focal abnormality.  Adrenals/Urinary Tract: Obstructing 7 x 3 mm stone in the distal left ureter just proximal to the ureterovesicular junction with moderate hydroureteronephrosis. This stone may have been present on the prior  exam, however was not causing obstruction at that time. Mild perinephric edema. No definite additional nonobstructing stones in either kidney. No right hydronephrosis. Simple cyst in the upper left kidney. Urinary bladder is minimally distended, questionable bladder wall thickening again seen. Normal adrenal glands.  Stomach/Bowel: Small hiatal hernia. Stomach physiologically distended. No bowel wall thickening, inflammation or abnormal distention. Normal appendix.  Vascular/Lymphatic: No significant vascular findings are present. No enlarged abdominal or pelvic lymph nodes.  Reproductive: Prostate is unremarkable.  Other: Post right inguinal hernia repair. Fat containing left inguinal hernia. Tiny fat containing umbilical hernia. No free air or free fluid.  Musculoskeletal: Stable degenerative change in the spine. There are no acute or suspicious osseous abnormalities.  IMPRESSION: Obstructing 7 x 3 mm left distal ureteric stone with moderate  left hydronephrosis andmild perinephric edema.   Electronically Signed   By: Jeb Levering M.D.   On: 03/04/2016 06:37   CT scan reviewed personally today and with the patient  Assessment & Plan:    1. Left ureteral stone We discussed various treatment options including ESWL vs. ureteroscopy, laser lithotripsy, and stent. We discussed the risks and benefits of both including bleeding, infection, damage to surrounding structures, efficacy with need for possible further intervention, and need for temporary ureteral stent.  Efficacy rates of each based on location were reviewed.  Option of MET discussed, however, given the duration of pain, suspect this stone will not pass spontaneously from at least 12/2015, unlikely.  He is most interested in ESWL.    Continue Flomax and strain urine until surgery.    KUB today, stone visible.  Warning symptoms reviewed, indications for urgent medical attention reivewed.  - DG Abd 1  View; Future   Hollice Espy, MD  [facility] [address], [place], [place] [address] [phone]  I spent 25 min with this patient of which greater than 50% was spent in counseling and coordination of care with the patient.

## 2016-03-10 ENCOUNTER — Other Ambulatory Visit: Payer: Self-pay | Admitting: Radiology

## 2016-03-10 ENCOUNTER — Telehealth: Payer: Self-pay | Admitting: Radiology

## 2016-03-10 DIAGNOSIS — N201 Calculus of ureter: Secondary | ICD-10-CM

## 2016-03-10 NOTE — Telephone Encounter (Signed)
Notified pt of arrival time of 8:30 on 03/13/16 to SDS for ESWL & [facility] [place] [facility] 03/27/16 @8 :45 with KUB prior. Questions answered regarding pre-procedural instructions given at office visit on 03/07/16. Pt voices understanding.

## 2016-03-10 NOTE — Telephone Encounter (Signed)
LMOM. Need to discuss ESWL scheduled 03/13/16.

## 2016-03-12 MED ORDER — CIPROFLOXACIN HCL 500 MG PO TABS
500.0000 mg | ORAL_TABLET | ORAL | Status: AC
  Administered 2016-03-13: 500 mg via ORAL

## 2016-03-13 ENCOUNTER — Ambulatory Visit
Admission: RE | Admit: 2016-03-13 | Discharge: 2016-03-13 | Disposition: A | Discharge status: Home / Self Care | Payer: Federal, State, Local not specified - PPO | Source: Ambulatory Visit | Attending: Urology | Admitting: Urology

## 2016-03-13 ENCOUNTER — Encounter: Payer: Self-pay | Admitting: *Deleted

## 2016-03-13 ENCOUNTER — Encounter
Admission: RE | Disposition: A | Discharge status: Home / Self Care | Payer: Self-pay | Source: Ambulatory Visit | Attending: Urology

## 2016-03-13 ENCOUNTER — Ambulatory Visit: Payer: Federal, State, Local not specified - PPO

## 2016-03-13 DIAGNOSIS — N132 Hydronephrosis with renal and ureteral calculous obstruction: Secondary | ICD-10-CM | POA: Insufficient documentation

## 2016-03-13 DIAGNOSIS — N201 Calculus of ureter: Secondary | ICD-10-CM

## 2016-03-13 DIAGNOSIS — K4090 Unilateral inguinal hernia, without obstruction or gangrene, not specified as recurrent: Secondary | ICD-10-CM | POA: Diagnosis not present

## 2016-03-13 HISTORY — PX: EXTRACORPOREAL SHOCK WAVE LITHOTRIPSY: SHX1557

## 2016-03-13 SURGERY — LITHOTRIPSY, ESWL
Anesthesia: Moderate Sedation | Laterality: Left

## 2016-03-13 MED ORDER — DIPHENHYDRAMINE HCL 25 MG PO CAPS
25.0000 mg | ORAL_CAPSULE | ORAL | Status: AC
  Administered 2016-03-13: 25 mg via ORAL

## 2016-03-13 MED ORDER — DIAZEPAM 5 MG PO TABS
10.0000 mg | ORAL_TABLET | ORAL | Status: AC
  Administered 2016-03-13: 10 mg via ORAL

## 2016-03-13 MED ORDER — OXYCODONE-ACETAMINOPHEN 5-325 MG PO TABS: 1.0000 | ORAL_TABLET | ORAL | 0 refills | Status: DC | PRN

## 2016-03-13 MED ORDER — DIAZEPAM 5 MG PO TABS
ORAL_TABLET | ORAL | Status: AC
  Administered 2016-03-13: 10 mg via ORAL
  Filled 2016-03-13: qty 2

## 2016-03-13 MED ORDER — CIPROFLOXACIN HCL 500 MG PO TABS
ORAL_TABLET | ORAL | Status: AC
  Administered 2016-03-13: 500 mg via ORAL
  Filled 2016-03-13: qty 1

## 2016-03-13 MED ORDER — DIPHENHYDRAMINE HCL 25 MG PO CAPS
ORAL_CAPSULE | ORAL | Status: AC
  Administered 2016-03-13: 25 mg via ORAL
  Filled 2016-03-13: qty 1

## 2016-03-13 MED ORDER — DEXTROSE-NACL 5-0.45 % IV SOLN
INTRAVENOUS | Status: DC
  Administered 2016-03-13: 09:00:00 via INTRAVENOUS

## 2016-03-13 NOTE — Discharge Instructions (Signed)
See [facility] discharge instructions in chart.  

## 2016-03-13 NOTE — H&P (View-Only) (Signed)
03/07/2016 3:14 PM   Erik Haring Jr. 1965/08/14 [id number]  Referring provider: No referring provider defined for this encounter.  Chief Complaint  Patient presents with  . Nephrolithiasis    follow up    HPI:   [name] who return to the office found to have a new left distal ureteral stone s/p recent ED visit.  1- Left distal ureteral stone Colicky, severe LLQ pain on night prior Thanksgiving, Christmas eve, and 03/04/16.  Found to have 7 x 3 mm left distal ureteral stone, no seen on previous contrast study with mild hydronephrosis.  WBC 12.7.  UA negative other than blood.  Cr 1.48, baseline unknown.    No previous stone episodes.    Continues to have occasional flank pain.  No fevers, chills, n/v.  No voiding symptoms.    2- History of prostatitis Initially referred by Dr.Sankar for further evaluation of left groin pain radiating to base of penis. He was initially seen for possible evaluation of a left inguinal hernia. He had a CT completed on 12/07/2015 which showed a very small fat-containing inguinal hernia which was not thought to be the source of his discomfort.   Found to have mild dysuria, urgency, frequency, tenderness on prostate exam.  Improved with cipro x 30 days.   PMH: No past medical history on file.  Surgical History: Past Surgical History:  Procedure Laterality Date  . COLONOSCOPY  2012  . HERNIA REPAIR Right 2002   Dr Jamal Collin    Home Medications:  Allergies as of 03/07/2016   No Known Allergies     Medication List       Accurate as of 03/07/16 11:59 PM. Always use your most recent med list.          ondansetron 4 MG disintegrating tablet Commonly known as:  ZOFRAN ODT Take 1 tablet (4 mg total) by mouth every 8 (eight) hours as needed for nausea or vomiting.   oxyCODONE-acetaminophen 5-325 MG tablet Commonly known as:  ROXICET Take 1 tablet by mouth every 4 (four) hours as needed for severe pain.   tamsulosin 0.4 MG Caps  capsule Commonly known as:  FLOMAX Take 1 capsule (0.4 mg total) by mouth daily.       Allergies: No Known Allergies  Family History: Family History  Problem Relation Age of Onset  . Cancer Mother     breast/bone  . Prostate cancer Neg Hx   . Kidney cancer Neg Hx   . Sickle cell anemia Neg Hx     Social History:  reports that he has never smoked. He has never used smokeless tobacco. He reports that he does not drink alcohol or use drugs.  ROS: UROLOGY Frequent Urination?: No Hard to postpone urination?: No Burning/pain with urination?: Yes Get up at night to urinate?: No Leakage of urine?: No Urine stream starts and stops?: No Trouble starting stream?: No Do you have to strain to urinate?: No Blood in urine?: No Urinary tract infection?: No Sexually transmitted disease?: No Injury to kidneys or bladder?: No Painful intercourse?: No Weak stream?: No Erection problems?: No Penile pain?: Yes  Gastrointestinal Nausea?: No Vomiting?: No Indigestion/heartburn?: No Diarrhea?: No Constipation?: No  Constitutional Fever: No Night sweats?: No Weight loss?: No Fatigue?: No  Skin Skin rash/lesions?: No Itching?: No  Eyes Blurred vision?: No Double vision?: No  Ears/Nose/Throat Sore throat?: No Sinus problems?: No  Hematologic/Lymphatic Swollen glands?: No Easy bruising?: No  Cardiovascular Leg swelling?: No Chest pain?: No  Respiratory Cough?: No Shortness of breath?: No  Endocrine Excessive thirst?: No  Musculoskeletal Back pain?: No Joint pain?: No  Neurological Headaches?: No Dizziness?: No  Psychologic Depression?: No Anxiety?: No  Physical Exam: BP 131/77   Pulse 80   Ht 5' 10"  (1.778 m)   Wt 160 lb (72.6 kg)   BMI 22.96 kg/m   Constitutional:  Alert and oriented, No acute distress. HEENT: [place] AT, moist mucus membranes.  Trachea midline, no masses. Cardiovascular: No clubbing, cyanosis, or edema. Respiratory: Normal  respiratory effort, no increased work of breathing. GI: Abdomen is soft, nontender, nondistended, no abdominal masses.   GU: No CVA tenderness. Skin: No rashes, bruises or suspicious lesions. Neurologic: Grossly intact, no focal deficits, moving all 4 extremities. Psychiatric: Normal mood and affect.  Laboratory Data: Lab Results  Component Value Date   CREATININE 1.48 (H) 03/04/2016    Urinalysis Component     Latest Ref Rng & Units 03/04/2016  Color, Urine     YELLOW YELLOW (A)  Appearance     CLEAR CLEAR (A)  Specific Gravity, Urine     1.005 - 1.030 1.021  pH     5.0 - 8.0 5.0  Glucose     NEGATIVE mg/dL 50 (A)  Hgb urine dipstick     NEGATIVE MODERATE (A)  Bilirubin Urine     NEGATIVE NEGATIVE  Ketones, ur     NEGATIVE mg/dL 5 (A)  Protein     NEGATIVE mg/dL NEGATIVE  Nitrite     NEGATIVE NEGATIVE  Leukocytes, UA     NEGATIVE NEGATIVE  RBC / HPF     0 - 5 RBC/hpf 0-5  WBC, UA     0 - 5 WBC/hpf 0-5  Bacteria, UA     NONE SEEN NONE SEEN  Squamous Epithelial / LPF     NONE SEEN NONE SEEN  Mucous      PRESENT  Hyaline Casts, UA      PRESENT    Pertinent Imaging: CLINICAL DATA:  Left flank pain.  EXAM: CT ABDOMEN AND PELVIS WITHOUT CONTRAST  TECHNIQUE: Multidetector CT imaging of the abdomen and pelvis was performed following the standard protocol without IV contrast.  COMPARISON:  CT 12/07/2015  FINDINGS: Lower chest: The lung bases are clear.  No pleural fluid.  Hepatobiliary: No focal lesion allowing for lack contrast. Gallbladder physiologically distended, no calcified stone. No biliary dilatation.  Pancreas: No ductal dilatation or inflammation.  Spleen: Upper limits normal in size measuring 12.3 cm. No focal abnormality.  Adrenals/Urinary Tract: Obstructing 7 x 3 mm stone in the distal left ureter just proximal to the ureterovesicular junction with moderate hydroureteronephrosis. This stone may have been present on the prior  exam, however was not causing obstruction at that time. Mild perinephric edema. No definite additional nonobstructing stones in either kidney. No right hydronephrosis. Simple cyst in the upper left kidney. Urinary bladder is minimally distended, questionable bladder wall thickening again seen. Normal adrenal glands.  Stomach/Bowel: Small hiatal hernia. Stomach physiologically distended. No bowel wall thickening, inflammation or abnormal distention. Normal appendix.  Vascular/Lymphatic: No significant vascular findings are present. No enlarged abdominal or pelvic lymph nodes.  Reproductive: Prostate is unremarkable.  Other: Post right inguinal hernia repair. Fat containing left inguinal hernia. Tiny fat containing umbilical hernia. No free air or free fluid.  Musculoskeletal: Stable degenerative change in the spine. There are no acute or suspicious osseous abnormalities.  IMPRESSION: Obstructing 7 x 3 mm left distal ureteric stone with moderate  left hydronephrosis andmild perinephric edema.   Electronically Signed   By: Jeb Levering M.D.   On: 03/04/2016 06:37   CT scan reviewed personally today and with the patient  Assessment & Plan:    1. Left ureteral stone We discussed various treatment options including ESWL vs. ureteroscopy, laser lithotripsy, and stent. We discussed the risks and benefits of both including bleeding, infection, damage to surrounding structures, efficacy with need for possible further intervention, and need for temporary ureteral stent.  Efficacy rates of each based on location were reviewed.  Option of MET discussed, however, given the duration of pain, suspect this stone will not pass spontaneously from at least 12/2015, unlikely.  He is most interested in ESWL.    Continue Flomax and strain urine until surgery.    KUB today, stone visible.  Warning symptoms reviewed, indications for urgent medical attention reivewed.  - DG Abd 1  View; Future   Hollice Espy, MD  [facility] [address], [place], [place] [address] [phone]  I spent 25 min with this patient of which greater than 50% was spent in counseling and coordination of care with the patient.

## 2016-03-13 NOTE — Interval H&P Note (Signed)
History and Physical Interval Note:  03/13/2016 9:00 AM  Erik Mooshomas R Laningham Jr.  has presented today for surgery, with the diagnosis of kidney stone  The various methods of treatment have been discussed with the patient and family. After consideration of risks, benefits and other options for treatment, the patient has consented to  Procedure(s): EXTRACORPOREAL SHOCK WAVE LITHOTRIPSY (ESWL) (Left) as a surgical intervention .  The patient's history has been reviewed, patient examined, no change in status, stable for surgery.  I have reviewed the patient's chart and labs.  Questions were answered to the patient's satisfaction.    RRR CTAB  Vanna ScotlandAshley Mayrin Schmuck

## 2016-03-14 ENCOUNTER — Encounter: Payer: Self-pay | Admitting: Urology

## 2016-03-27 ENCOUNTER — Ambulatory Visit
Admission: RE | Admit: 2016-03-27 | Discharge: 2016-03-27 | Disposition: A | Discharge status: Home / Self Care | Payer: Federal, State, Local not specified - PPO | Source: Ambulatory Visit | Attending: Urology | Admitting: Urology

## 2016-03-27 ENCOUNTER — Encounter: Payer: Self-pay | Admitting: Urology

## 2016-03-27 ENCOUNTER — Ambulatory Visit: Payer: Federal, State, Local not specified - PPO | Admitting: Urology

## 2016-03-27 VITALS — BP 113/67 | HR 58 | Ht 70.0 in | Wt 158.3 lb

## 2016-03-27 DIAGNOSIS — N201 Calculus of ureter: Secondary | ICD-10-CM | POA: Diagnosis present

## 2016-03-27 NOTE — Progress Notes (Signed)
03/27/2016 8:37 AM   Erik Yu. [birth year] [id number]  Referring provider: No referring provider defined for this encounter.  Chief Complaint  Patient presents with  . Follow-up    2 week post litho / kub    HPI:   51 yo WM who return to the office to review the results of his KUB after undergoing ESWL on 03/13/2016 for a 7 x 3 mm left distal ureteral stone.    1- Left distal ureteral stone Colicky, severe LLQ pain on night prior Thanksgiving, Christmas eve, and 03/04/16.  Found to have 7 x 3 mm left distal ureteral stone, no seen on previous contrast study with mild hydronephrosis.  WBC 12.7.  UA negative other than blood.  Cr 1.48, baseline unknown.    No previous stone episodes.    Underwent Left ESWL on 03/13/2016; post operative course was uneventful and as expected - stone passed on 04/21/2016  KUB taken today did not demonstrate the previous left distal stone.    No flank pain.  No fevers, chills, n/v.  No voiding symptoms.    2- History of prostatitis Initially referred by Dr.Sankar for further evaluation of left groin pain radiating to base of penis. He was initially seen for possible evaluation of a left inguinal hernia. He had a CT completed on 12/07/2015 which showed a very small fat-containing inguinal hernia which was not thought to be the source of his discomfort.   Found to have mild dysuria, urgency, frequency, tenderness on prostate exam.  Improved with cipro x 30 days.  No symptoms of prostatitis today.     PMH: Past Medical History:  Diagnosis Date  . Kidney stone     Surgical History: Past Surgical History:  Procedure Laterality Date  . COLONOSCOPY  2012  . EXTRACORPOREAL SHOCK WAVE LITHOTRIPSY Left 03/13/2016   Procedure: EXTRACORPOREAL SHOCK WAVE LITHOTRIPSY (ESWL);  Surgeon: [name], MD;  Location: [facility] ORS;  Service: Urology;  Laterality: Left;  . HERNIA REPAIR Right 2002   Dr [name]    Home Medications:  Allergies as of  03/27/2016   No Known Allergies     Medication List       Accurate as of 03/27/16  8:37 AM. Always use your most recent med list.          ondansetron 4 MG disintegrating tablet Commonly known as:  ZOFRAN ODT Take 1 tablet (4 mg total) by mouth every 8 (eight) hours as needed for nausea or vomiting.   oxyCODONE-acetaminophen 5-325 MG tablet Commonly known as:  ROXICET Take 1 tablet by mouth every 4 (four) hours as needed for severe pain.   tamsulosin 0.4 MG Caps capsule Commonly known as:  FLOMAX Take 1 capsule (0.4 mg total) by mouth daily.       Allergies: No Known Allergies  Family History: Family History  Problem Relation Age of Onset  . Cancer Mother     breast/bone  . Prostate cancer Neg Hx   . Kidney cancer Neg Hx   . Sickle cell anemia Neg Hx     Social History:  reports that he has never smoked. He has never used smokeless tobacco. He reports that he does not drink alcohol or use drugs.  ROS: UROLOGY Frequent Urination?: No Hard to postpone urination?: No Burning/pain with urination?: No Get up at night to urinate?: No Leakage of urine?: No Urine stream starts and stops?: No Trouble starting stream?: No Do you have to strain to urinate?: No Blood  in urine?: No Urinary tract infection?: No Sexually transmitted disease?: No Injury to kidneys or bladder?: No Painful intercourse?: No Weak stream?: No Erection problems?: No Penile pain?: No  Gastrointestinal Nausea?: No Vomiting?: No Indigestion/heartburn?: No Diarrhea?: No Constipation?: No  Constitutional Fever: No Night sweats?: No Weight loss?: No Fatigue?: No  Skin Skin rash/lesions?: No Itching?: No  Eyes Blurred vision?: No Double vision?: No  Ears/Nose/Throat Sore throat?: No Sinus problems?: No  Hematologic/Lymphatic Swollen glands?: No Easy bruising?: No  Cardiovascular Leg swelling?: No Chest pain?: No  Respiratory Cough?: No Shortness of breath?:  No  Endocrine Excessive thirst?: No  Musculoskeletal Back pain?: No Joint pain?: No  Neurological Headaches?: No Dizziness?: No  Psychologic Depression?: No Anxiety?: No  Physical Exam: BP 113/67   Pulse (!) 58   Ht 5\' 10"  (1.778 m)   Wt 158 lb 4.8 oz (71.8 kg)   BMI 22.71 kg/m   Constitutional:  Alert and oriented, No acute distress. HEENT: [place] AT, moist mucus membranes.  Trachea midline, no masses. Cardiovascular: No clubbing, cyanosis, or edema. Respiratory: Normal respiratory effort, no increased work of breathing. GI: Abdomen is soft, nontender, nondistended, no abdominal masses.   GU: No CVA tenderness. Skin: No rashes, bruises or suspicious lesions. Neurologic: Grossly intact, no focal deficits, moving all 4 extremities. Psychiatric: Normal mood and affect.  Laboratory Data: Lab Results  Component Value Date   CREATININE 1.48 (H) 03/04/2016    Urinalysis Component     Latest Ref Rng & Units 03/04/2016  Color, Urine     YELLOW YELLOW (A)  Appearance     CLEAR CLEAR (A)  Specific Gravity, Urine     1.005 - 1.030 1.021  pH     5.0 - 8.0 5.0  Glucose     NEGATIVE mg/dL 50 (A)  Hgb urine dipstick     NEGATIVE MODERATE (A)  Bilirubin Urine     NEGATIVE NEGATIVE  Ketones, ur     NEGATIVE mg/dL 5 (A)  Protein     NEGATIVE mg/dL NEGATIVE  Nitrite     NEGATIVE NEGATIVE  Leukocytes, UA     NEGATIVE NEGATIVE  RBC / HPF     0 - 5 RBC/hpf 0-5  WBC, UA     0 - 5 WBC/hpf 0-5  Bacteria, UA     NONE SEEN NONE SEEN  Squamous Epithelial / LPF     NONE SEEN NONE SEEN  Mucous      PRESENT  Hyaline Casts, UA      PRESENT    Pertinent Imaging: CLINICAL DATA:  Recent ureteral calculus  EXAM: ABDOMEN - 1 VIEW  COMPARISON:  March 13, 2016  FINDINGS: Previously noted distal left ureteral calculus no longer evident. There are multiple stable phleboliths throughout the pelvis. No new evident calcifications. There is moderate stool throughout  the colon. There is no bowel dilatation or air-fluid level suggesting bowel obstruction. No free air. Lung bases clear.  IMPRESSION: Previously noted distal left ureteral calculus no longer evident. Stable phleboliths throughout the pelvis. No new calcifications. Bowel gas pattern unremarkable. No bowel obstruction or free air evident.   Electronically Signed   By: Bretta BangWilliam  Woodruff III M.D.   On: 03/27/2016 08:37  KUB reviewed personally and with the patient  Assessment & Plan:    1. Left ureteral stone  - s/p ESWL - stone passed  - stone sent for analysis  - obtain RUS to ensure no hydronephrosis persistent  - RTC to review results  Zara Council, [facility] [address] [place], [place] [address] [phone]

## 2016-03-31 ENCOUNTER — Other Ambulatory Visit: Payer: Self-pay | Admitting: Urology

## 2016-04-04 ENCOUNTER — Ambulatory Visit
Admission: RE | Admit: 2016-04-04 | Discharge: 2016-04-04 | Disposition: A | Discharge status: Home / Self Care | Payer: Federal, State, Local not specified - PPO | Source: Ambulatory Visit | Attending: Urology | Admitting: Urology

## 2016-04-04 ENCOUNTER — Telehealth: Payer: Self-pay

## 2016-04-04 DIAGNOSIS — N201 Calculus of ureter: Secondary | ICD-10-CM | POA: Diagnosis present

## 2016-04-04 DIAGNOSIS — N281 Cyst of kidney, acquired: Secondary | ICD-10-CM | POA: Diagnosis not present

## 2016-04-04 NOTE — Telephone Encounter (Signed)
-----   Message from Harle BattiestShannon A McGowan, PA-C sent at 04/04/2016  8:26 AM EST ----- Please notify the patient that his RUS was normal.  His stone was calcium oxalate in composition.  We can offer him a 24 hour metabolic work up at this time.  If he chooses to do so, we can cancel his appointment and reschedule when the results are in.

## 2016-04-04 NOTE — Telephone Encounter (Signed)
Spoke with pt in reference to RUS results and litholink. Pt stated that [facility] everything at his f/u visit.

## 2016-04-17 ENCOUNTER — Ambulatory Visit: Payer: Federal, State, Local not specified - PPO | Admitting: Urology

## 2016-04-17 ENCOUNTER — Encounter: Payer: Self-pay | Admitting: Urology

## 2016-04-17 VITALS — BP 125/72 | HR 68 | Ht 70.0 in | Wt 158.9 lb

## 2016-04-17 DIAGNOSIS — N201 Calculus of ureter: Secondary | ICD-10-CM | POA: Diagnosis not present

## 2016-04-17 DIAGNOSIS — N132 Hydronephrosis with renal and ureteral calculous obstruction: Secondary | ICD-10-CM

## 2016-04-17 NOTE — Progress Notes (Signed)
04/17/2016 4:19 PM   Erik Pence Jr. [birth year] [id number]  Referring provider: No referring provider defined for this encounter.  Chief Complaint  Patient presents with  . Results    RUS and Stone Analysis    HPI: 51 yo WM who presents today to discuss his RUS results and stone analysis report.   51 yo WM underwent ESWL on 03/13/2016 for a 7 x 3 mm left distal ureteral stone.    1- Left distal ureteral stone Colicky, severe LLQ pain on night prior Thanksgiving, Christmas eve, and 03/04/16.  Found to have 7 x 3 mm left distal ureteral stone, no seen on previous contrast study with mild hydronephrosis.  WBC 12.7.  UA negative other than blood.  Cr 1.48, baseline unknown.    No previous stone episodes.    Underwent Left ESWL on 03/13/2016; post operative course was uneventful and as expected - stone passed on 04/17/16  KUB taken today did not demonstrate the previous left distal stone.    No flank pain.  No fevers, chills, n/v.  No voiding symptoms.    2- History of prostatitis Initially referred by Dr.Sankar for further evaluation of left groin pain radiating to base of penis. He was initially seen for possible evaluation of a left inguinal hernia. He had a CT completed on 12/07/2015 which showed a very small fat-containing inguinal hernia which was not thought to be the source of his discomfort.   Found to have mild dysuria, urgency, frequency, tenderness on prostate exam.  Improved with cipro x 30 days.  No symptoms of prostatitis today.     PMH: Past Medical History:  Diagnosis Date  . Kidney stone     Surgical History: Past Surgical History:  Procedure Laterality Date  . COLONOSCOPY  2012  . EXTRACORPOREAL SHOCK WAVE LITHOTRIPSY Left 03/13/2016   Procedure: EXTRACORPOREAL SHOCK WAVE LITHOTRIPSY (ESWL);  Surgeon: [name], MD;  Location: [facility] ORS;  Service: Urology;  Laterality: Left;  . HERNIA REPAIR Right 2002   Dr [name]    Home Medications:    Allergies as of 04/17/2016   No Known Allergies     Medication List       Accurate as of 04/17/16  4:19 PM. Always use your most recent med list.          ondansetron 4 MG disintegrating tablet Commonly known as:  ZOFRAN ODT Take 1 tablet (4 mg total) by mouth every 8 (eight) hours as needed for nausea or vomiting.   oxyCODONE-acetaminophen 5-325 MG tablet Commonly known as:  ROXICET Take 1 tablet by mouth every 4 (four) hours as needed for severe pain.   tamsulosin 0.4 MG Caps capsule Commonly known as:  FLOMAX Take 1 capsule (0.4 mg total) by mouth daily.       Allergies: No Known Allergies  Family History: Family History  Problem Relation Age of Onset  . Cancer Mother     breast/bone  . Prostate cancer Neg Hx   . Kidney cancer Neg Hx   . Sickle cell anemia Neg Hx   . Bladder Cancer Neg Hx     Social History:  reports that he has never smoked. He has never used smokeless tobacco. He reports that he does not drink alcohol or use drugs.  ROS: UROLOGY Frequent Urination?: No Hard to postpone urination?: No Burning/pain with urination?: No Get up at night to urinate?: No Leakage of urine?: No Urine stream starts and stops?: No Trouble starting stream?: No Do  you have to strain to urinate?: No Blood in urine?: No Urinary tract infection?: No Sexually transmitted disease?: No Injury to kidneys or bladder?: No Painful intercourse?: No Weak stream?: No Erection problems?: No Penile pain?: No  Gastrointestinal Nausea?: No Vomiting?: No Indigestion/heartburn?: No Diarrhea?: No Constipation?: No  Constitutional Fever: No Night sweats?: No Weight loss?: No Fatigue?: No  Skin Skin rash/lesions?: No Itching?: No  Eyes Blurred vision?: No Double vision?: No  Ears/Nose/Throat Sore throat?: No Sinus problems?: No  Hematologic/Lymphatic Swollen glands?: No Easy bruising?: No  Cardiovascular Leg swelling?: No Chest pain?:  No  Respiratory Cough?: No Shortness of breath?: No  Endocrine Excessive thirst?: No  Musculoskeletal Back pain?: No Joint pain?: No  Neurological Headaches?: No Dizziness?: No  Psychologic Depression?: No Anxiety?: No  Physical Exam: BP 125/72   Pulse 68   Ht 5\' 10"  (1.778 m)   Wt 158 lb 14.4 oz (72.1 kg)   BMI 22.80 kg/m   Constitutional:  Alert and oriented, No acute distress. HEENT: [place] AT, moist mucus membranes.  Trachea midline, no masses. Cardiovascular: No clubbing, cyanosis, or edema. Respiratory: Normal respiratory effort, no increased work of breathing. GI: Abdomen is soft, nontender, nondistended, no abdominal masses.   GU: No CVA tenderness. Skin: No rashes, bruises or suspicious lesions. Neurologic: Grossly intact, no focal deficits, moving all 4 extremities. Psychiatric: Normal mood and affect.  Laboratory Data: Lab Results  Component Value Date   CREATININE 1.48 (H) 03/04/2016    Urinalysis Component     Latest Ref Rng & Units 03/04/2016  Color, Urine     YELLOW YELLOW (A)  Appearance     CLEAR CLEAR (A)  Specific Gravity, Urine     1.005 - 1.030 1.021  pH     5.0 - 8.0 5.0  Glucose     NEGATIVE mg/dL 50 (A)  Hgb urine dipstick     NEGATIVE MODERATE (A)  Bilirubin Urine     NEGATIVE NEGATIVE  Ketones, ur     NEGATIVE mg/dL 5 (A)  Protein     NEGATIVE mg/dL NEGATIVE  Nitrite     NEGATIVE NEGATIVE  Leukocytes, UA     NEGATIVE NEGATIVE  RBC / HPF     0 - 5 RBC/hpf 0-5  WBC, UA     0 - 5 WBC/hpf 0-5  Bacteria, UA     NONE SEEN NONE SEEN  Squamous Epithelial / LPF     NONE SEEN NONE SEEN  Mucous      PRESENT  Hyaline Casts, UA      PRESENT    Pertinent Imaging: CLINICAL DATA:  Left ureteral stone  EXAM: RENAL / URINARY TRACT ULTRASOUND COMPLETE  COMPARISON:  CT abdomen/pelvis dated 03/04/2016  FINDINGS: Right Kidney:  Length: 9.9 cm.  No mass or hydronephrosis.  Left Kidney:  Length: 10.0 cm. 10 x 12 x 11  mm interpolar cyst. No hydronephrosis.  Bladder:2.   Within normal limits.  Bilateral bladder jets are visualized.  IMPRESSION: 12 mm interpolar left renal cyst.  No hydronephrosis.   Electronically Signed   By: Charline Bills M.D.   On: 04/04/2016 08:09  RUS reviewed personally and with the patient  Assessment & Plan:    1. Left ureteral stone  - s/p ESWL - stone passed  - calcium oxalate monohydrate in composition - encouraged to increase fluids to 10 to 12 cups daily, drink juices high in citrate and avoid sodas  - offered 24 hour urine work up -  patient deferred  - RTC prn   2. Left hydronephrosis  - resolved   Michiel CowboySHANNON Artemis Loyal, [facility]  [facility] [address], [address] [place], [place] [address] [phone]

## 2017-04-21 IMAGING — US US SCROTUM
1 series · 14 of 25 positions shown · non-contrast
Comparison: None.

CLINICAL DATA: Left testicular pain, onset 5 hours prior.

EXAM:
SCROTAL ULTRASOUND
DOPPLER ULTRASOUND OF THE TESTICLES
TECHNIQUE: Complete ultrasound examination of the testicles, epididymis, and
other scrotal structures was performed. Color and spectral Doppler
ultrasound were also utilized to evaluate blood flow to the
testicles.

[Series 1: us scrotum · 0.08mm/px · 14 of 57 slices shown]
[im 1/57]
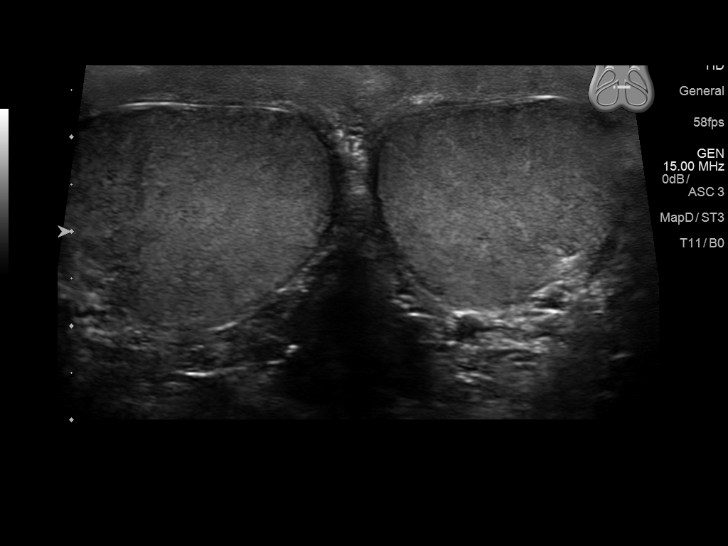
[im 5/57]
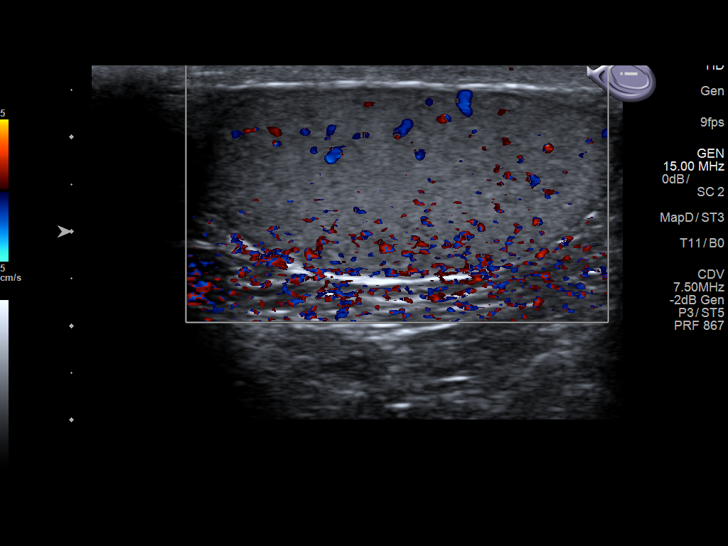
[im 10/57]
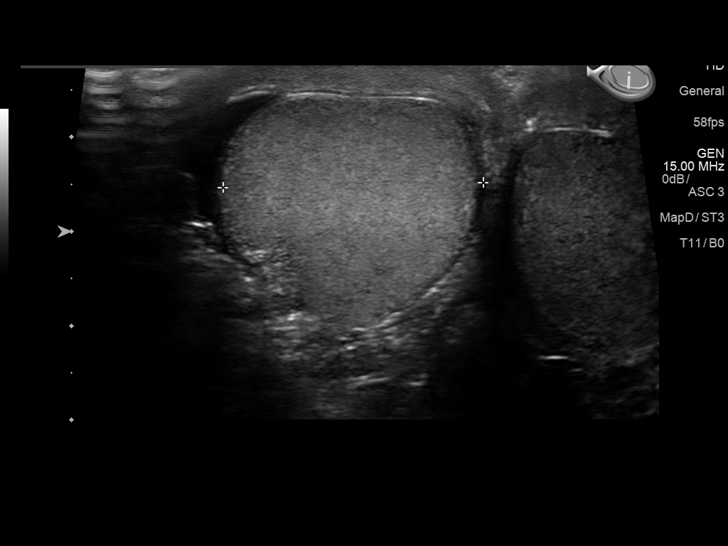
[im 15/57]
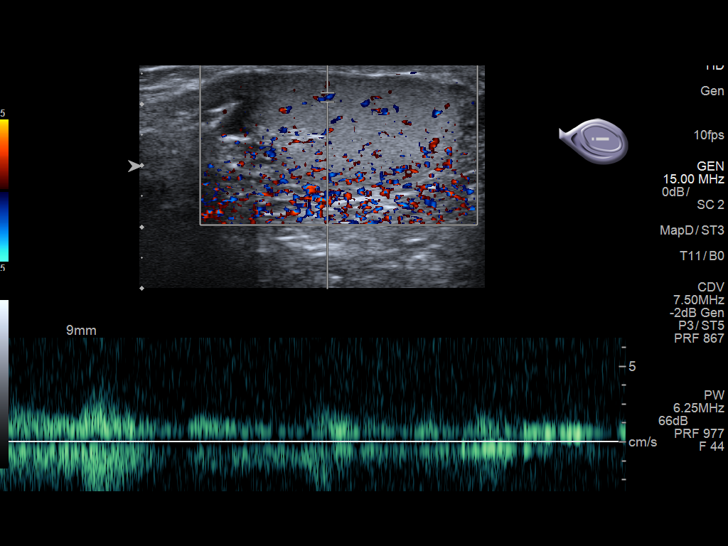
[im 19/57]
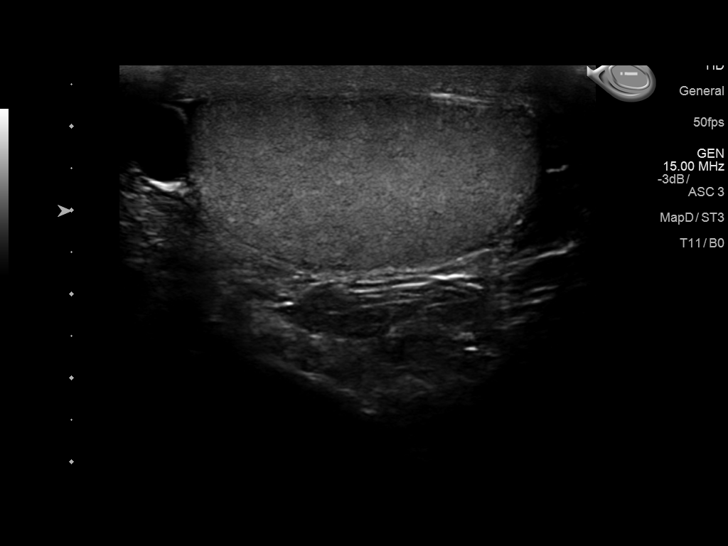
[im 22/57]
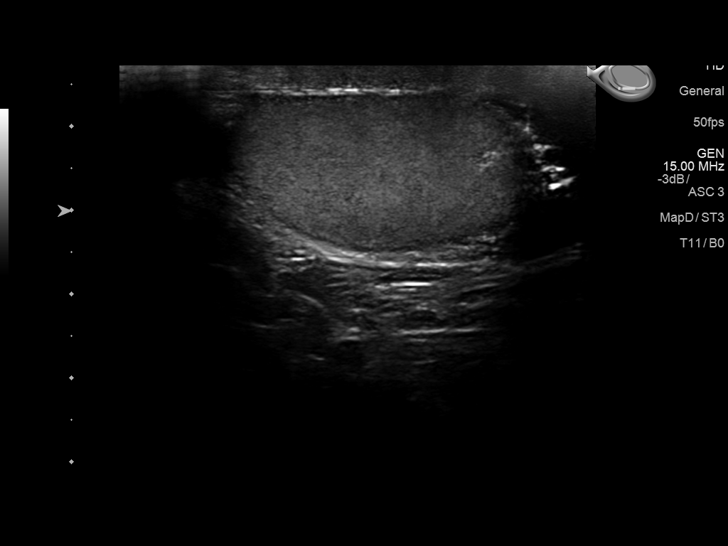
[im 26/57]
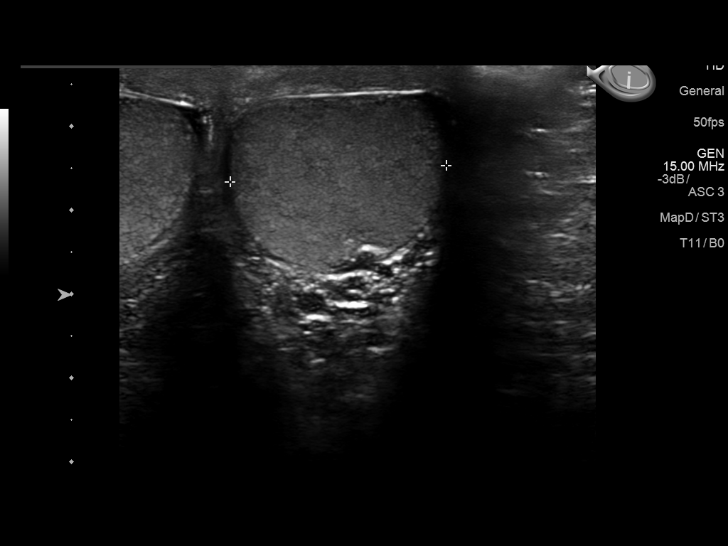
[im 31/57]
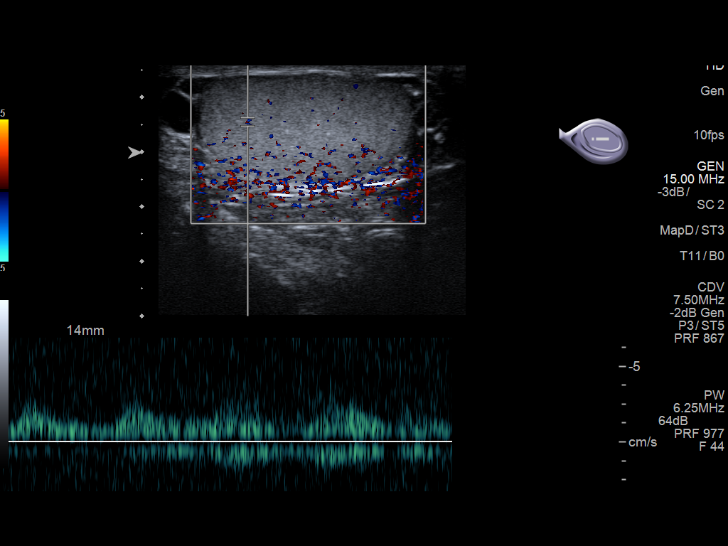
[im 36/57]
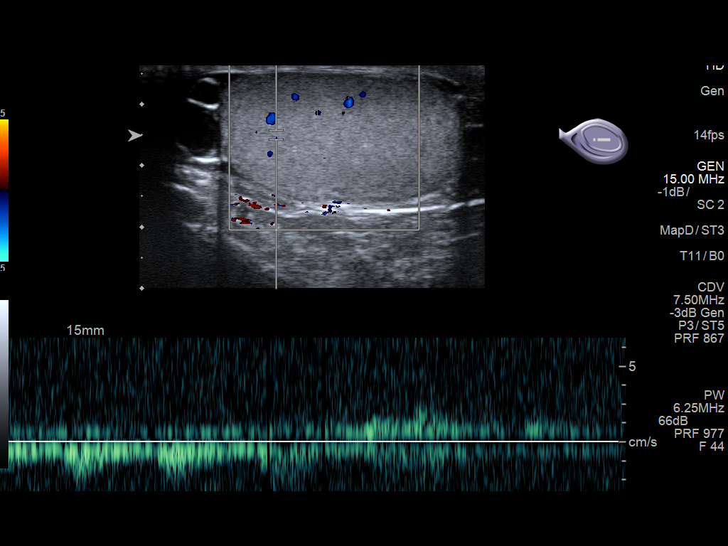
[im 38/57]
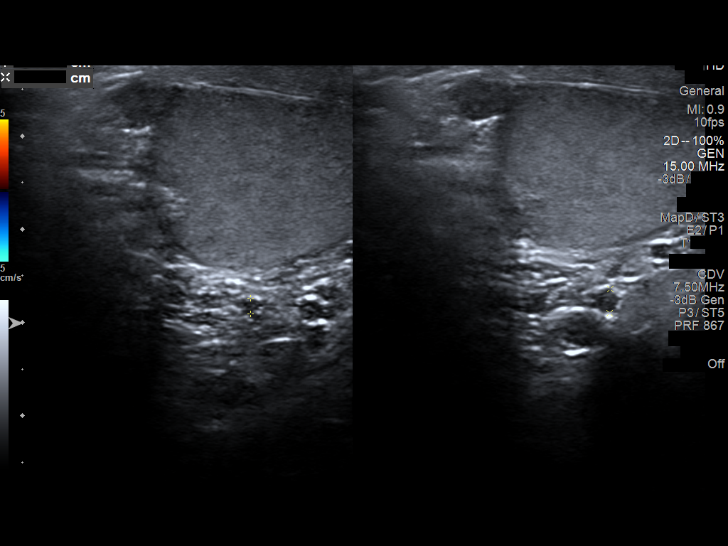
[im 43/57]
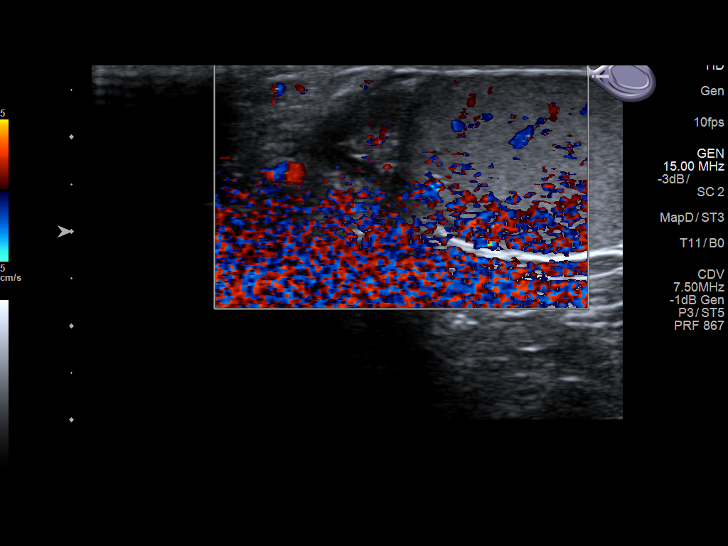
[im 47/57]
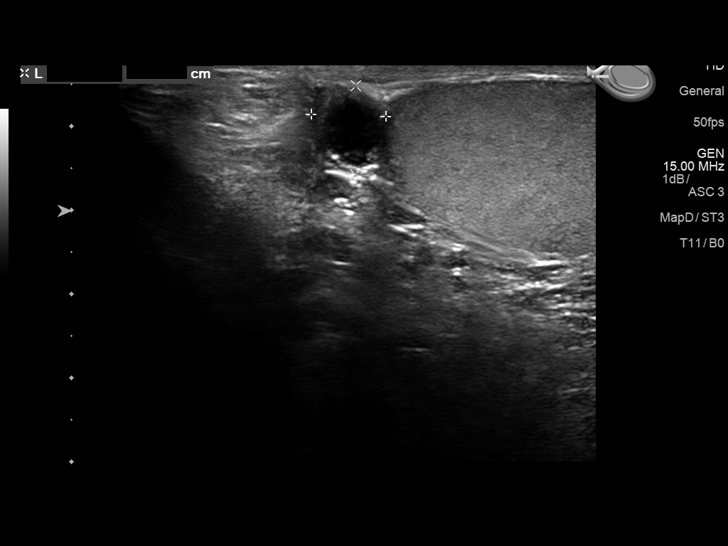
[im 52/57]
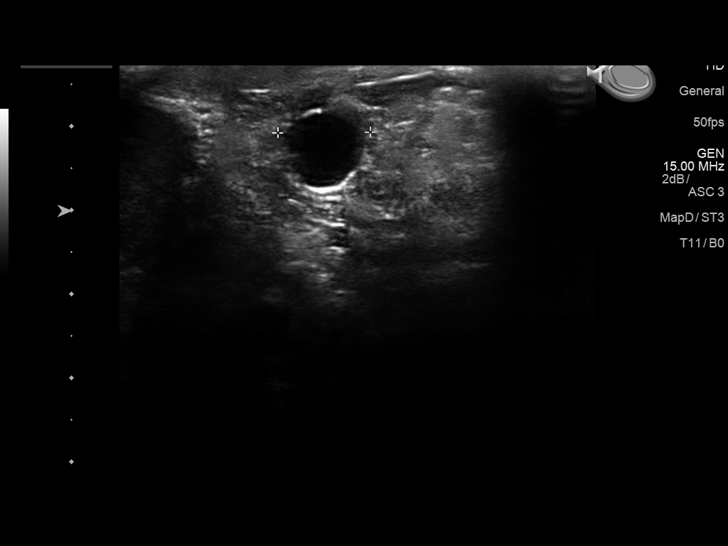
[im 57/57]
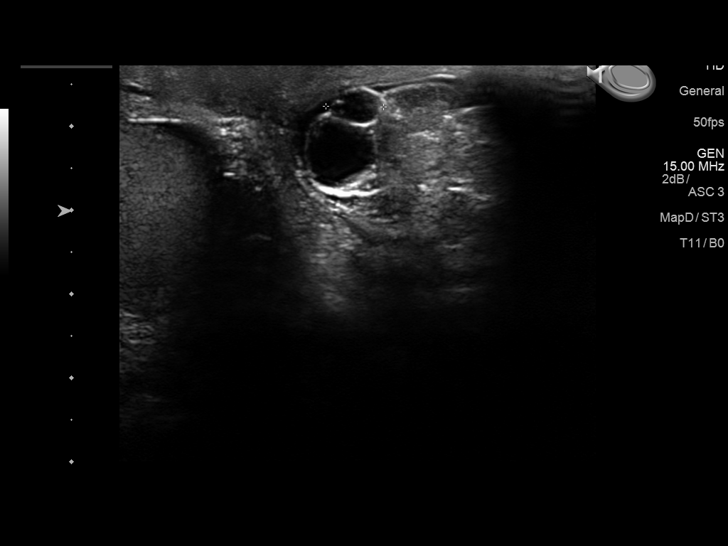

[14 of 25 positions shown; findings below may reference images not displayed]

FINDINGS: Right testicle

Measurements: 4.5 x 2.1 x 2.8 cm. No mass or microlithiasis
visualized. Normal blood flow.

Left testicle

Measurements: 4.2 x 2.1 x 2.6 cm. No mass or microlithiasis
visualized. Normal blood flow.

Right epididymis:  Normal in size and appearance.

Left epididymis: 2 epididymal head cysts, largest measuring 1 cm. No
hyperemia.

Hydrocele:  None visualized.

Varicocele: Borderline bilaterally measuring 2.6 mm on the right and
2.5 mm on the left.

Pulsed Doppler interrogation of both testes demonstrates normal low
resistance arterial and venous waveforms bilaterally.
IMPRESSION: 1. No acute abnormality.  No testicular torsion.
2. Incidental left epididymal head cysts.
3. Borderline bilateral varicoceles.

## 2022-12-04 ENCOUNTER — Ambulatory Visit: Payer: Federal, State, Local not specified - PPO | Admitting: Gastroenterology

## 2022-12-10 ENCOUNTER — Ambulatory Visit: Payer: Federal, State, Local not specified - PPO | Admitting: Gastroenterology

## 2022-12-10 ENCOUNTER — Encounter: Payer: Self-pay | Admitting: Gastroenterology

## 2022-12-10 VITALS — BP 127/82 | HR 75 | Temp 98.5°F | Ht 69.0 in | Wt 166.0 lb

## 2022-12-10 DIAGNOSIS — R1319 Other dysphagia: Secondary | ICD-10-CM

## 2022-12-10 DIAGNOSIS — R1310 Dysphagia, unspecified: Secondary | ICD-10-CM

## 2022-12-10 DIAGNOSIS — Z1211 Encounter for screening for malignant neoplasm of colon: Secondary | ICD-10-CM

## 2022-12-10 NOTE — Progress Notes (Signed)
Gastroenterology Consultation  Referring Provider:     Geanie Logan, MD Primary Care Physician:  Erik Yu, No Pcp Per Primary Gastroenterologist:  Dr. Servando Snare     Reason for Consultation:     Dysphagia        HPI:   Erik Yu. is a 57 y.o. y/o male referred for consultation & management of dysphagia by Dr. Patient, No Pcp Per.  This Erik Yu comes in today with a history of dysphagia.  The Erik Yu was seen by ENT and had reported that he was having food getting stuck when he ate.  He reported today that he sometimes has to chase it down with fluids.  He has had an occasion where the fluids have come back up.  There is no report of any unexplained weight loss fevers chills nausea vomiting black stools or bloody stools.  The Erik Yu does report that he has had heartburn in the past but it was while he was drinking juice and that was years ago.  There is no report of any other GI issues at the present time.  Past Medical History:  Diagnosis Date   Kidney stone     Past Surgical History:  Procedure Laterality Date   COLONOSCOPY  2012   EXTRACORPOREAL SHOCK WAVE LITHOTRIPSY Left 03/13/2016   Procedure: EXTRACORPOREAL SHOCK WAVE LITHOTRIPSY (ESWL);  Surgeon: [name], MD;  Location: [facility] ORS;  Service: Urology;  Laterality: Left;   HERNIA REPAIR Right 2002   Dr [name]    Prior to Admission medications   Not on File    Family History  Problem Relation Age of Onset   Cancer Mother        breast/bone   Prostate cancer Neg Hx    Kidney cancer Neg Hx    Sickle cell anemia Neg Hx    Bladder Cancer Neg Hx      Social History   Tobacco Use   Smoking status: Never   Smokeless tobacco: Never  Substance Use Topics   Alcohol use: No   Drug use: No    Allergies as of 12/10/2022   (No Known Allergies)    Review of Systems:    All systems reviewed and negative except where noted in HPI.   Physical Exam:  BP 127/82 (BP Location: Left Arm, Erik Yu Position:  Sitting, Cuff Size: Normal)   Pulse 75   Temp 98.5 F (36.9 C) (Oral)   Ht 5\' 9"  (1.753 m)   Wt 166 lb (75.3 kg)   BMI 24.51 kg/m  No LMP for male Erik Yu. General:   Alert,  Well-developed, well-nourished, pleasant and cooperative in NAD Head:  Normocephalic and atraumatic. Eyes:  Sclera clear, no icterus.   Conjunctiva pink. Ears:  Normal auditory acuity. Neck:  Supple; no masses or thyromegaly. Lungs:  Respirations even and unlabored.  Clear throughout to auscultation.   No wheezes, crackles, or rhonchi. No acute distress. Heart:  Regular rate and rhythm; no murmurs, clicks, rubs, or gallops. Abdomen:  Normal bowel sounds.  No bruits.  Soft, non-tender and non-distended without masses, hepatosplenomegaly or hernias noted.  No guarding or rebound tenderness.  Negative Carnett sign.   Rectal:  Deferred.  Pulses:  Normal pulses noted. Extremities:  No clubbing or edema.  No cyanosis. Neurologic:  Alert and oriented x3;  grossly normal neurologically. Skin:  Intact without significant lesions or rashes.  No jaundice. Lymph Nodes:  No significant cervical adenopathy. Psych:  Alert and cooperative. Normal mood and affect.  Imaging Studies: No results found.  Assessment and Plan:   Erik Yu. is a 57 y.o. y/o male who comes in with what sounds like an esophageal stricture.  The Erik Yu will be set up for an EGD with possible dilation.  The Erik Yu has also been told to avoid trying to chase the food down with liquids due to the risk of aspiration.  After the Erik Yu had left it became apparent that the Erik Yu has also not had a colonoscopy on record since 2012.  The Erik Yu will be contacted and offered a screening colonoscopy at the time of the upper endoscopy.  The Erik Yu has been explained the plan agrees with it.    [name], MD. Clementeen Graham    Note: This dictation was prepared with Dragon dictation along with smaller phrase technology. Any transcriptional errors that  result from this process are unintentional.

## 2022-12-24 ENCOUNTER — Encounter: Payer: Self-pay | Admitting: Gastroenterology

## 2022-12-30 MED ORDER — NA SULFATE-K SULFATE-MG SULF 17.5-3.13-1.6 GM/177ML PO SOLN: 1.0000 | Freq: Once | ORAL | 0 refills | Status: AC

## 2022-12-30 NOTE — Anesthesia Preprocedure Evaluation (Addendum)
Anesthesia Evaluation  Patient identified by MRN, date of birth, ID band Patient awake    Reviewed: Allergy & Precautions, H&P , NPO status , Patient's Chart, lab work & pertinent test results  Airway Mallampati: I  TM Distance: >3 FB Neck ROM: Full    Dental no notable dental hx. (+) Caps   Pulmonary neg pulmonary ROS   Pulmonary exam normal breath sounds clear to auscultation       Cardiovascular negative cardio ROS Normal cardiovascular exam Rhythm:Regular Rate:Normal     Neuro/Psych negative neurological ROS  negative psych ROS   GI/Hepatic negative GI ROS, Neg liver ROS,,,  Endo/Other  negative endocrine ROS    Renal/GU Renal diseasenegative Renal ROS  negative genitourinary   Musculoskeletal negative musculoskeletal ROS (+)    Abdominal   Peds negative pediatric ROS (+)  Hematology negative hematology ROS (+)   Anesthesia Other Findings Hx renal calculus  Reproductive/Obstetrics negative OB ROS                              Anesthesia Physical Anesthesia Plan  ASA: 1  Anesthesia Plan: General   Post-op Pain Management:    Induction: Intravenous  PONV Risk Score and Plan:   Airway Management Planned: Natural Airway and Nasal Cannula  Additional Equipment:   Intra-op Plan:   Post-operative Plan:   Informed Consent: I have reviewed the patients History and Physical, chart, labs and discussed the procedure including the risks, benefits and alternatives for the proposed anesthesia with the patient or authorized representative who has indicated his/her understanding and acceptance.     Dental Advisory Given  Plan Discussed with: Anesthesiologist, CRNA and Surgeon  Anesthesia Plan Comments: (Patient consented for risks of anesthesia including but not limited to:  - adverse reactions to medications - risk of airway placement if required - damage to eyes, teeth, lips  or other oral mucosa - nerve damage due to positioning  - sore throat or hoarseness - Damage to heart, brain, nerves, lungs, other parts of body or loss of life  Patient voiced understanding and assent.)         Anesthesia Quick Evaluation

## 2022-12-30 NOTE — Addendum Note (Signed)
Addended by: Roena Malady on: 12/30/2022 08:25 AM   Modules accepted: Orders

## 2023-01-02 ENCOUNTER — Other Ambulatory Visit: Payer: Self-pay

## 2023-01-02 ENCOUNTER — Encounter: Payer: Self-pay | Admitting: Gastroenterology

## 2023-01-02 ENCOUNTER — Ambulatory Visit
Admission: RE | Admit: 2023-01-02 | Discharge: 2023-01-02 | Disposition: A | Discharge status: Home / Self Care | Payer: Federal, State, Local not specified - PPO | Attending: Gastroenterology | Admitting: Gastroenterology

## 2023-01-02 ENCOUNTER — Ambulatory Visit: Payer: Federal, State, Local not specified - PPO | Admitting: Anesthesiology

## 2023-01-02 ENCOUNTER — Encounter
Admission: RE | Disposition: A | Discharge status: Home / Self Care | Payer: Self-pay | Source: Home / Self Care | Attending: Gastroenterology

## 2023-01-02 DIAGNOSIS — K449 Diaphragmatic hernia without obstruction or gangrene: Secondary | ICD-10-CM | POA: Diagnosis not present

## 2023-01-02 DIAGNOSIS — Z1211 Encounter for screening for malignant neoplasm of colon: Secondary | ICD-10-CM

## 2023-01-02 DIAGNOSIS — K2100 Gastro-esophageal reflux disease with esophagitis, without bleeding: Secondary | ICD-10-CM | POA: Diagnosis not present

## 2023-01-02 DIAGNOSIS — K222 Esophageal obstruction: Secondary | ICD-10-CM

## 2023-01-02 DIAGNOSIS — K2270 Barrett's esophagus without dysplasia: Secondary | ICD-10-CM

## 2023-01-02 DIAGNOSIS — R1310 Dysphagia, unspecified: Secondary | ICD-10-CM | POA: Insufficient documentation

## 2023-01-02 DIAGNOSIS — K635 Polyp of colon: Secondary | ICD-10-CM | POA: Diagnosis not present

## 2023-01-02 DIAGNOSIS — K641 Second degree hemorrhoids: Secondary | ICD-10-CM

## 2023-01-02 DIAGNOSIS — D123 Benign neoplasm of transverse colon: Secondary | ICD-10-CM | POA: Diagnosis not present

## 2023-01-02 DIAGNOSIS — K2289 Other specified disease of esophagus: Secondary | ICD-10-CM | POA: Diagnosis not present

## 2023-01-02 HISTORY — PX: COLONOSCOPY WITH PROPOFOL: SHX5780

## 2023-01-02 HISTORY — PX: ESOPHAGOGASTRODUODENOSCOPY (EGD) WITH PROPOFOL: SHX5813

## 2023-01-02 SURGERY — COLONOSCOPY WITH PROPOFOL
Anesthesia: General

## 2023-01-02 MED ORDER — LACTATED RINGERS IV SOLN: INTRAVENOUS | Status: DC | PRN

## 2023-01-02 MED ORDER — LIDOCAINE HCL (CARDIAC) PF 100 MG/5ML IV SOSY
PREFILLED_SYRINGE | INTRAVENOUS | Status: DC | PRN
  Administered 2023-01-02: 100 mg via INTRAVENOUS

## 2023-01-02 MED ORDER — GLYCOPYRROLATE 0.2 MG/ML IJ SOLN
INTRAMUSCULAR | Status: DC | PRN
  Administered 2023-01-02: .2 mg via INTRAVENOUS

## 2023-01-02 MED ORDER — PROPOFOL 10 MG/ML IV BOLUS
INTRAVENOUS | Status: DC | PRN
  Administered 2023-01-02 (×5): 40 mg via INTRAVENOUS
  Administered 2023-01-02: 120 mg via INTRAVENOUS
  Administered 2023-01-02 (×2): 40 mg via INTRAVENOUS

## 2023-01-02 MED ORDER — PANTOPRAZOLE SODIUM 40 MG PO TBEC: 40.0000 mg | DELAYED_RELEASE_TABLET | Freq: Two times a day (BID) | ORAL | 2 refills | Status: DC

## 2023-01-02 SURGICAL SUPPLY — 13 items
BALLN DILATOR CRE 0-12 8 (BALLOONS) ×1
BALLOON DILATOR CRE 0-12 8 (BALLOONS) IMPLANT
BLOCK BITE 60FR ADLT L/F GRN (MISCELLANEOUS) ×1 IMPLANT
FORCEPS BIOP RAD 4 LRG CAP 4 (CUTTING FORCEPS) IMPLANT
GOWN CVR UNV OPN BCK APRN NK (MISCELLANEOUS) ×2 IMPLANT
GOWN ISOL THUMB LOOP REG UNIV (MISCELLANEOUS) ×2
KIT PRC NS LF DISP ENDO (KITS) ×1 IMPLANT
KIT PROCEDURE OLYMPUS (KITS) ×1
MANIFOLD NEPTUNE II (INSTRUMENTS) ×1 IMPLANT
SNARE COLD EXACTO (MISCELLANEOUS) IMPLANT
SYR INFLATION 60ML (SYRINGE) IMPLANT
TRAP ETRAP POLY (MISCELLANEOUS) IMPLANT
WATER STERILE IRR 250ML POUR (IV SOLUTION) ×1 IMPLANT

## 2023-01-02 NOTE — Op Note (Signed)
[facility] Gastroenterology Patient Name: Erik Yu Procedure Date: 01/02/2023 9:50 AM MRN: [id number] Account #: [id number] Date of Birth: 06/07/1965 Admit Type: Outpatient Age: 57 Room: [facility] OR ROOM 01 Gender: Male Note Status: Finalized Instrument Name: [id number] Procedure:             Colonoscopy Indications:           Screening for colorectal malignant neoplasm Providers:             [name] MD, MD Medicines:             Propofol per Anesthesia Complications:         No immediate complications. Procedure:             Pre-Anesthesia Assessment:                        - Prior to the procedure, a History and Physical was                         performed, and patient medications and allergies were                         reviewed. The patient's tolerance of previous                         anesthesia was also reviewed. The risks and benefits                         of the procedure and the sedation options and risks                         were discussed with the patient. All questions were                         answered, and informed consent was obtained. Prior                         Anticoagulants: The patient has taken no anticoagulant                         or antiplatelet agents. ASA Grade Assessment: II - A                         patient with mild systemic disease. After reviewing                         the risks and benefits, the patient was deemed in                         satisfactory condition to undergo the procedure.                        After obtaining informed consent, the colonoscope was                         passed under direct vision. Throughout the procedure,                         the patient's blood pressure,  pulse, and oxygen                         saturations were monitored continuously. The                         Colonoscope was introduced through the anus and                         advanced to the the cecum,  identified by appendiceal                         orifice and ileocecal valve. The colonoscopy was                         performed without difficulty. The patient tolerated                         the procedure well. The quality of the bowel                         preparation was good. Findings:      The perianal and digital rectal examinations were normal.      A 4 mm polyp was found in the sigmoid colon. The polyp was sessile. The       polyp was removed with a cold snare. Resection and retrieval were       complete.      A 4 mm polyp was found in the transverse colon. The polyp was sessile.       The polyp was removed with a cold snare. Resection and retrieval were       complete.      Non-bleeding internal hemorrhoids were found during retroflexion. The       hemorrhoids were Grade II (internal hemorrhoids that prolapse but reduce       spontaneously). Impression:            - One 4 mm polyp in the sigmoid colon, removed with a                         cold snare. Resected and retrieved.                        - One 4 mm polyp in the transverse colon, removed with                         a cold snare. Resected and retrieved.                        - Non-bleeding internal hemorrhoids. Recommendation:        - Discharge patient to home.                        - Resume previous diet.                        - Continue present medications.                        - Await pathology results.                        -  If the pathology report reveals adenomatous tissue,                         then repeat the colonoscopy for surveillance in 7                         years. Procedure Code(s):     --- Professional ---                        [id number], Colonoscopy, flexible; with removal of                         tumor(s), polyp(s), or other lesion(s) by snare                         technique Diagnosis Code(s):     --- Professional ---                        Z12.11, Encounter for screening for  malignant neoplasm                         of colon                        D12.5, Benign neoplasm of sigmoid colon CPT copyright 2022 American [employer] Association. All rights reserved. The codes documented in this report are preliminary and upon coder review may  be revised to meet current compliance requirements. [name] MD, MD 01/02/2023 10:20:13 AM This report has been signed electronically. Number of Addenda: 0 Note Initiated On: 01/02/2023 9:50 AM Scope Withdrawal Time: 0 hours 8 minutes 28 seconds  Total Procedure Duration: 0 hours 12 minutes 6 seconds  Estimated Blood Loss:  Estimated blood loss: none.      [facility]

## 2023-01-02 NOTE — Op Note (Signed)
[facility] Gastroenterology Patient Name: Erik Yu Procedure Date: 01/02/2023 9:51 AM MRN: [id number] Account #: [id number] Date of Birth: December 10, 1965 Admit Type: Outpatient Age: 57 Room: [facility] OR ROOM 01 Gender: Male Note Status: Finalized Instrument Name: [id number] Procedure:             Upper GI endoscopy Indications:           Dysphagia Providers:             [name] MD, MD Medicines:             Propofol per Anesthesia Complications:         No immediate complications. Procedure:             Pre-Anesthesia Assessment:                        - Prior to the procedure, a History and Physical was                         performed, and patient medications and allergies were                         reviewed. The patient's tolerance of previous                         anesthesia was also reviewed. The risks and benefits                         of the procedure and the sedation options and risks                         were discussed with the patient. All questions were                         answered, and informed consent was obtained. Prior                         Anticoagulants: The patient has taken no anticoagulant                         or antiplatelet agents. ASA Grade Assessment: II - A                         patient with mild systemic disease. After reviewing                         the risks and benefits, the patient was deemed in                         satisfactory condition to undergo the procedure.                        After obtaining informed consent, the endoscope was                         passed under direct vision. Throughout the procedure,                         the patient's blood pressure, pulse, and  oxygen                         saturations were monitored continuously. The Endoscope                         was introduced through the mouth, and advanced to the                         second part of duodenum. The upper GI  endoscopy was                         accomplished without difficulty. The patient tolerated                         the procedure well. Findings:      A medium-sized hiatal hernia was present.      LA Grade C (one or more mucosal breaks continuous between tops of 2 or       more mucosal folds, less than 75% circumference) esophagitis with no       bleeding was found in the lower third of the esophagus.      One severe stenosis was found at the gastroesophageal junction. This       stenosis measured 9 mm (inner diameter). The stenosis was traversed. A       TTS dilator was passed through the scope. Dilation with a 11-28-10 mm       balloon dilator was performed to 12 mm. The dilation site was examined       following endoscope reinsertion and showed moderate mucosal disruption.       Biopsies were taken with a cold forceps for histology.      The stomach was normal.      The examined duodenum was normal. Impression:            - Medium-sized hiatal hernia.                        - LA Grade C reflux esophagitis with no bleeding.                        - Esophageal stenosis. Dilated. Biopsied.                        - Normal stomach.                        - Normal examined duodenum. Recommendation:        - Discharge patient to home.                        - Resume previous diet.                        - Continue present medications.                        - Await pathology results.                        - Use a proton pump inhibitor PO BID.                        -  Repeat upper endoscopy in 3 weeks for retreatment. Procedure Code(s):     --- Professional ---                        [phone], Esophagogastroduodenoscopy, flexible,                         transoral; with transendoscopic balloon dilation of                         esophagus (less than 30 mm diameter)                        43239, 59, Esophagogastroduodenoscopy, flexible,                         transoral; with biopsy, single  or multiple Diagnosis Code(s):     --- Professional ---                        R13.10, Dysphagia, unspecified                        K22.2, Esophageal obstruction CPT copyright 2022 American Medical Association. All rights reserved. The codes documented in this report are preliminary and upon coder review may  be revised to meet current compliance requirements. [name] MD, MD 01/02/2023 10:05:37 AM This report has been signed electronically. Number of Addenda: 0 Note Initiated On: 01/02/2023 9:51 AM Total Procedure Duration: 0 hours 6 minutes 0 seconds  Estimated Blood Loss:  Estimated blood loss: none.      [facility]

## 2023-01-02 NOTE — Anesthesia Postprocedure Evaluation (Signed)
Anesthesia Post Note  Patient: Erik Yu.  Procedure(s) Performed: COLONOSCOPY WITH PROPOFOL WITH POLYPECTOMY ESOPHAGOGASTRODUODENOSCOPY (EGD) WITH PROPOFOL with balloon dilation  Patient location during evaluation: PACU Anesthesia Type: General Level of consciousness: awake and alert Pain management: pain level controlled Vital Signs Assessment: post-procedure vital signs reviewed and stable Respiratory status: spontaneous breathing, nonlabored ventilation, respiratory function stable and patient connected to nasal cannula oxygen Cardiovascular status: blood pressure returned to baseline and stable Postop Assessment: no apparent nausea or vomiting Anesthetic complications: no   No notable events documented.   Last Vitals:  Vitals:   01/02/23 1045 01/02/23 1058  BP: (!) 107/91 126/83  Resp: 16 16  Temp:    SpO2: 96% 100%    Last Pain:  Vitals:   01/02/23 1058  TempSrc:   PainSc: 0-No pain                 Francisco Eyerly C Andric Kerce

## 2023-01-02 NOTE — Transfer of Care (Signed)
Immediate Anesthesia Transfer of Care Note  Patient: Erik Yu.  Procedure(s) Performed: COLONOSCOPY WITH PROPOFOL WITH POLYPECTOMY ESOPHAGOGASTRODUODENOSCOPY (EGD) WITH PROPOFOL with balloon dilation  Patient Location: PACU  Anesthesia Type: General  Level of Consciousness: awake, alert  and patient cooperative  Airway and Oxygen Therapy: Patient Spontanous Breathing and Patient connected to supplemental oxygen  Post-op Assessment: Post-op Vital signs reviewed, Patient's Cardiovascular Status Stable, Respiratory Function Stable, Patent Airway and No signs of Nausea or vomiting  Post-op Vital Signs: Reviewed and stable  Complications: No notable events documented.

## 2023-01-02 NOTE — H&P (Signed)
   [name], MD [facility] [address], [address] [place], [place] [address] Phone:[phone] Fax : [phone]  Primary Care Physician:  Patient, No Pcp Per Primary Gastroenterologist:  Dr. Servando Snare  Pre-Procedure History & Physical: HPI:  Erik Fronda. is a 57 y.o. male is here for an endoscopy and colonoscopy.   Past Medical History:  Diagnosis Date   Kidney stone     Past Surgical History:  Procedure Laterality Date   COLONOSCOPY  2012   EXTRACORPOREAL SHOCK WAVE LITHOTRIPSY Left 03/13/2016   Procedure: EXTRACORPOREAL SHOCK WAVE LITHOTRIPSY (ESWL);  Surgeon: [name], MD;  Location: [facility] ORS;  Service: Urology;  Laterality: Left;   HERNIA REPAIR Right 2002   Dr [name]    Prior to Admission medications   Not on File    Allergies as of 12/10/2022   (No Known Allergies)    Family History  Problem Relation Age of Onset   Cancer Mother        breast/bone   Prostate cancer Neg Hx    Kidney cancer Neg Hx    Sickle cell anemia Neg Hx    Bladder Cancer Neg Hx     Social History   Socioeconomic History   Marital status: Married    Spouse name: Not on file   Number of children: Not on file   Years of education: Not on file   Highest education level: Not on file  Occupational History   Not on file  Tobacco Use   Smoking status: Never   Smokeless tobacco: Never  Vaping Use   Vaping status: Never Used  Substance and Sexual Activity   Alcohol use: No    Comment: rare- a few times per year   Drug use: No   Sexual activity: Not on file  Other Topics Concern   Not on file  Social History Narrative   Not on file   Social Determinants of Health   Financial Resource Strain: Not on file  Food Insecurity: Not on file  Transportation Needs: Not on file  Physical Activity: Not on file  Stress: Not on file  Social Connections: Not on file  Intimate Partner Violence: Not on file    Review of Systems: See HPI, otherwise negative ROS  Physical  Exam: BP 120/72   Temp 98.5 F (36.9 C) (Temporal)   Resp 14   Ht 5' 9.02" (1.753 m)   Wt 73.4 kg   SpO2 97%   BMI 23.88 kg/m  General:   Alert,  pleasant and cooperative in NAD Head:  Normocephalic and atraumatic. Neck:  Supple; no masses or thyromegaly. Lungs:  Clear throughout to auscultation.    Heart:  Regular rate and rhythm. Abdomen:  Soft, nontender and nondistended. Normal bowel sounds, without guarding, and without rebound.   Neurologic:  Alert and  oriented x4;  grossly normal neurologically.  Impression/Plan: Erik Moos. is here for an endoscopy and colonoscopy to be performed for dysphagia and screening  Risks, benefits, limitations, and alternatives regarding  endoscopy and colonoscopy have been reviewed with the patient.  Questions have been answered.  All parties agreeable.   [name], MD  01/02/2023, 9:26 AM

## 2023-01-03 ENCOUNTER — Encounter: Payer: Self-pay | Admitting: Gastroenterology

## 2023-01-07 LAB — SURGICAL PATHOLOGY

## 2023-01-10 ENCOUNTER — Encounter: Payer: Self-pay | Admitting: Gastroenterology

## 2023-01-13 ENCOUNTER — Other Ambulatory Visit: Payer: Self-pay

## 2023-01-13 DIAGNOSIS — K222 Esophageal obstruction: Secondary | ICD-10-CM

## 2023-01-23 ENCOUNTER — Encounter: Payer: Self-pay | Admitting: Gastroenterology

## 2023-01-28 NOTE — Anesthesia Preprocedure Evaluation (Addendum)
Anesthesia Evaluation    Airway Mallampati: I  TM Distance: >3 FB Neck ROM: Full    Dental no notable dental hx. (+) Caps   Pulmonary    Pulmonary exam normal breath sounds clear to auscultation       Cardiovascular Normal cardiovascular exam Rhythm:Regular Rate:Normal     Neuro/Psych    GI/Hepatic   Endo/Other    Renal/GU      Musculoskeletal   Abdominal   Peds  Hematology   Anesthesia Other Findings Colonoscopy 01-02-23 with Dr. Juel Burrow anesthesiologist  Reproductive/Obstetrics                             Anesthesia Physical Anesthesia Plan  ASA: 1  Anesthesia Plan: General   Post-op Pain Management:    Induction: Intravenous  PONV Risk Score and Plan:   Airway Management Planned: Natural Airway and Nasal Cannula  Additional Equipment:   Intra-op Plan:   Post-operative Plan:   Informed Consent: I have reviewed the patients History and Physical, chart, labs and discussed the procedure including the risks, benefits and alternatives for the proposed anesthesia with the patient or authorized representative who has indicated his/her understanding and acceptance.     Dental Advisory Given  Plan Discussed with: Anesthesiologist, CRNA and Surgeon  Anesthesia Plan Comments: (Patient consented for risks of anesthesia including but not limited to:  - adverse reactions to medications - risk of airway placement if required - damage to eyes, teeth, lips or other oral mucosa - nerve damage due to positioning  - sore throat or hoarseness - Damage to heart, brain, nerves, lungs, other parts of body or loss of life  Patient voiced understanding and assent.)        Anesthesia Quick Evaluation

## 2023-01-30 ENCOUNTER — Ambulatory Visit: Payer: Federal, State, Local not specified - PPO | Admitting: Anesthesiology

## 2023-01-30 ENCOUNTER — Other Ambulatory Visit: Payer: Self-pay

## 2023-01-30 ENCOUNTER — Ambulatory Visit
Admission: RE | Admit: 2023-01-30 | Discharge: 2023-01-30 | Disposition: A | Discharge status: Home / Self Care | Payer: Federal, State, Local not specified - PPO | Attending: Gastroenterology | Admitting: Gastroenterology

## 2023-01-30 ENCOUNTER — Encounter
Admission: RE | Disposition: A | Discharge status: Home / Self Care | Payer: Self-pay | Source: Home / Self Care | Attending: Gastroenterology

## 2023-01-30 ENCOUNTER — Encounter: Payer: Self-pay | Admitting: Gastroenterology

## 2023-01-30 DIAGNOSIS — K222 Esophageal obstruction: Secondary | ICD-10-CM | POA: Insufficient documentation

## 2023-01-30 DIAGNOSIS — R1310 Dysphagia, unspecified: Secondary | ICD-10-CM | POA: Insufficient documentation

## 2023-01-30 DIAGNOSIS — K449 Diaphragmatic hernia without obstruction or gangrene: Secondary | ICD-10-CM | POA: Diagnosis not present

## 2023-01-30 HISTORY — PX: ESOPHAGOGASTRODUODENOSCOPY (EGD) WITH PROPOFOL: SHX5813

## 2023-01-30 HISTORY — PX: ESOPHAGEAL DILATION: SHX303

## 2023-01-30 SURGERY — ESOPHAGOGASTRODUODENOSCOPY (EGD) WITH PROPOFOL
Anesthesia: General | Site: Mouth

## 2023-01-30 MED ORDER — PROPOFOL 10 MG/ML IV BOLUS
INTRAVENOUS | Status: AC
  Filled 2023-01-30: qty 20

## 2023-01-30 MED ORDER — LACTATED RINGERS IV SOLN: INTRAVENOUS | Status: DC

## 2023-01-30 MED ORDER — PROPOFOL 10 MG/ML IV BOLUS
INTRAVENOUS | Status: DC | PRN
  Administered 2023-01-30: 30 mg via INTRAVENOUS
  Administered 2023-01-30: 100 mg via INTRAVENOUS
  Administered 2023-01-30 (×2): 50 mg via INTRAVENOUS

## 2023-01-30 MED ORDER — SODIUM CHLORIDE 0.9 % IV SOLN: INTRAVENOUS | Status: DC

## 2023-01-30 MED ORDER — LIDOCAINE HCL (CARDIAC) PF 100 MG/5ML IV SOSY
PREFILLED_SYRINGE | INTRAVENOUS | Status: DC | PRN
  Administered 2023-01-30: 50 mg via INTRAVENOUS

## 2023-01-30 SURGICAL SUPPLY — 28 items
BALLN DILATOR 12-15 8 (BALLOONS) ×2
BALLN DILATOR 15-18 8 (BALLOONS)
BALLN DILATOR CRE 0-12 8 (BALLOONS)
BALLN DILATOR ESOPH 8 10 CRE (MISCELLANEOUS) IMPLANT
BALLOON DILATOR 12-15 8 (BALLOONS) IMPLANT
BALLOON DILATOR 15-18 8 (BALLOONS) IMPLANT
BALLOON DILATOR CRE 0-12 8 (BALLOONS) IMPLANT
BLOCK BITE 60FR ADLT L/F GRN (MISCELLANEOUS) ×2 IMPLANT
CLIP HMST 235XBRD CATH ROT (MISCELLANEOUS) IMPLANT
ELECT REM PT RETURN 9FT ADLT (ELECTROSURGICAL)
ELECTRODE REM PT RTRN 9FT ADLT (ELECTROSURGICAL) IMPLANT
FCP ESCP3.2XJMB 240X2.8X (MISCELLANEOUS)
FORCEPS BIOP RAD 4 LRG CAP 4 (CUTTING FORCEPS) IMPLANT
FORCEPS ESCP3.2XJMB 240X2.8X (MISCELLANEOUS) IMPLANT
GOWN CVR UNV OPN BCK APRN NK (MISCELLANEOUS) ×4 IMPLANT
INJECTOR VARIJECT VIN23 (MISCELLANEOUS) IMPLANT
KIT DEFENDO VALVE AND CONN (KITS) IMPLANT
KIT PRC NS LF DISP ENDO (KITS) ×2 IMPLANT
MANIFOLD NEPTUNE II (INSTRUMENTS) ×2 IMPLANT
MARKER SPOT ENDO TATTOO 5ML (MISCELLANEOUS) IMPLANT
RETRIEVER NET PLAT FOOD (MISCELLANEOUS) IMPLANT
SNARE SHORT THROW 13M SML OVAL (MISCELLANEOUS) IMPLANT
SNARE SHORT THROW 30M LRG OVAL (MISCELLANEOUS) IMPLANT
SYR INFLATION 60ML (SYRINGE) IMPLANT
TRAP ETRAP POLY (MISCELLANEOUS) IMPLANT
VARIJECT INJECTOR VIN23 (MISCELLANEOUS)
WATER STERILE IRR 250ML POUR (IV SOLUTION) ×2 IMPLANT
WIRE CRE 18-20MM 8CM F G (MISCELLANEOUS) IMPLANT

## 2023-01-30 NOTE — H&P (Signed)
[name], MD [facility] [address] [place], [place] [address] Phone:[phone] Fax : [phone]  Primary Care Physician:  Patient, No Pcp Per Primary Gastroenterologist:  Dr. Servando Snare  Pre-Procedure History & Physical: HPI:  Erik Yu. is a 57 y.o. male is here for an endoscopy.   Past Medical History:  Diagnosis Date   Kidney stone     Past Surgical History:  Procedure Laterality Date   COLONOSCOPY  2012   COLONOSCOPY WITH PROPOFOL N/A 01/02/2023   Procedure: COLONOSCOPY WITH PROPOFOL WITH POLYPECTOMY;  Surgeon: [name], MD;  Location: [facility];  Service: Endoscopy;  Laterality: N/A;   ESOPHAGOGASTRODUODENOSCOPY (EGD) WITH PROPOFOL N/A 01/02/2023   Procedure: ESOPHAGOGASTRODUODENOSCOPY (EGD) WITH PROPOFOL with balloon dilation;  Surgeon: [name], MD;  Location: [facility];  Service: Endoscopy;  Laterality: N/A;  10-12 balloon   EXTRACORPOREAL SHOCK WAVE LITHOTRIPSY Left 03/13/2016   Procedure: EXTRACORPOREAL SHOCK WAVE LITHOTRIPSY (ESWL);  Surgeon: [name], MD;  Location: [facility] OR[facility];  Service: Urology;  Laterality: Left;   HERNIA REPAIR Right 2002   Dr [name]    Prior to Admission medications   Medication Sig Start Date End Date Taking? Authorizing Provider  pantoprazole (PROTONIX) 40 MG tablet Take 1 tablet (40 mg total) by mouth 2 (two) times daily. 01/02/23 01/02/24 Yes [name], MD    Allergies as of 01/13/2023 - Review Complete 01/02/2023  Allergen Reaction Noted   Shrimp (diagnostic) Nausea And Vomiting 12/24/2022    Family History  Problem Relation Age of Onset   Cancer Mother        breast/bone   Prostate cancer Neg Hx    Kidney cancer Neg Hx    Sickle cell anemia Neg Hx    Bladder Cancer Neg Hx     Social History   Socioeconomic History   Marital status: Married    Spouse name: Not on file   Number of children: Not on file   Years of education: Not on file   Highest education level: Not on  file  Occupational History   Not on file  Tobacco Use   Smoking status: Never   Smokeless tobacco: Never  Vaping Use   Vaping status: Never Used  Substance and Sexual Activity   Alcohol use: No    Comment: rare- a few times per year   Drug use: No   Sexual activity: Not on file  Other Topics Concern   Not on file  Social History Narrative   Not on file   Social Drivers of Health   Financial Resource Strain: Not on file  Food Insecurity: Not on file  Transportation Needs: Not on file  Physical Activity: Not on file  Stress: Not on file  Social Connections: Not on file  Intimate Partner Violence: Not on file    Review of Systems: See HPI, otherwise negative ROS  Physical Exam: BP (!) 141/77   Pulse (!) 48   Temp 98.1 F (36.7 C) (Temporal)   Resp 19   Ht 5\' 9"  (1.753 m)   Wt 75.6 kg   SpO2 98%   BMI 24.60 kg/m  General:   Alert,  pleasant and cooperative in NAD Head:  Normocephalic and atraumatic. Neck:  Supple; no masses or thyromegaly. Lungs:  Clear throughout to auscultation.    Heart:  Regular rate and rhythm. Abdomen:  Soft, nontender and nondistended. Normal bowel sounds, without guarding, and without rebound.   Neurologic:  Alert and  oriented x4;  grossly normal neurologically.  Impression/Plan: Erik Yu. is here for an endoscopy to be performed for esophageal striture  Risks, benefits, limitations, and alternatives regarding  endoscopy have been reviewed with the patient.  Questions have been answered.  All parties agreeable.   [name], MD  01/30/2023, 8:39 AM

## 2023-01-30 NOTE — Op Note (Signed)
[facility] Gastroenterology Patient Name: Erik Yu Procedure Date: 01/30/2023 9:03 AM MRN: [id number] Account #: [id number] Date of Birth: 1965/10/29 Admit Type: Outpatient Age: 57 Room: [facility] OR ROOM 01 Gender: Male Note Status: Finalized Instrument Name: [id number] Procedure:             Upper GI endoscopy Indications:           Dysphagia Providers:             [name] MD, MD Medicines:             Propofol per Anesthesia Complications:         No immediate complications. Procedure:             Pre-Anesthesia Assessment:                        - Prior to the procedure, a History and Physical was                         performed, and patient medications and allergies were                         reviewed. The patient's tolerance of previous                         anesthesia was also reviewed. The risks and benefits                         of the procedure and the sedation options and risks                         were discussed with the patient. All questions were                         answered, and informed consent was obtained. Prior                         Anticoagulants: The patient has taken no anticoagulant                         or antiplatelet agents. ASA Grade Assessment: II - A                         patient with mild systemic disease. After reviewing                         the risks and benefits, the patient was deemed in                         satisfactory condition to undergo the procedure.                        After obtaining informed consent, the endoscope was                         passed under direct vision. Throughout the procedure,                         the patient's blood pressure, pulse, and  oxygen                         saturations were monitored continuously. The Endoscope                         was introduced through the mouth, and advanced to the                         second part of duodenum. The upper GI  endoscopy was                         accomplished without difficulty. The patient tolerated                         the procedure well. Findings:      A small hiatal hernia was present.      One benign-appearing, intrinsic moderate stenosis was found at the       gastroesophageal junction. This stenosis measured 1.2 cm (inner       diameter). The stenosis was traversed. A TTS dilator was passed through       the scope. Dilation with a 12-13.5-15 mm balloon dilator was performed       to 15 mm. The dilation site was examined following endoscope reinsertion       and showed moderate improvement in luminal narrowing.      The stomach was normal.      The examined duodenum was normal. Impression:            - Small hiatal hernia.                        - Benign-appearing esophageal stenosis. Dilated.                        - Normal stomach.                        - Normal examined duodenum.                        - No specimens collected. Recommendation:        - Discharge patient to home.                        - Resume previous diet.                        - Continue present medications.                        - Repeat upper endoscopy PRN for retreatment. Procedure Code(s):     --- Professional ---                        [phone], Esophagogastroduodenoscopy, flexible,                         transoral; with transendoscopic balloon dilation of                         esophagus (less than 30 mm diameter) Diagnosis Code(s):     ---  Professional ---                        R13.10, Dysphagia, unspecified                        K22.2, Esophageal obstruction CPT copyright 2022 American Medical Association. All rights reserved. The codes documented in this report are preliminary and upon coder review may  be revised to meet current compliance requirements. [name] MD, MD 01/30/2023 9:17:02 AM This report has been signed electronically. Number of Addenda: 0 Note Initiated On: 01/30/2023  9:03 AM Total Procedure Duration: 0 hours 3 minutes 52 seconds  Estimated Blood Loss:  Estimated blood loss: none.      [facility]

## 2023-01-30 NOTE — Transfer of Care (Signed)
Immediate Anesthesia Transfer of Care Note  Patient: Erik Yu.  Procedure(s) Performed: ESOPHAGOGASTRODUODENOSCOPY (EGD) WITH PROPOFOL ESOPHAGEAL DILATION (Mouth)  Patient Location: PACU  Anesthesia Type: General  Level of Consciousness: awake, alert  and patient cooperative  Airway and Oxygen Therapy: Patient Spontanous Breathing and Patient connected to supplemental oxygen  Post-op Assessment: Post-op Vital signs reviewed, Patient's Cardiovascular Status Stable, Respiratory Function Stable, Patent Airway and No signs of Nausea or vomiting  Post-op Vital Signs: Reviewed and stable  Complications: No notable events documented.

## 2023-01-30 NOTE — Anesthesia Postprocedure Evaluation (Signed)
Anesthesia Post Note  Patient: Erik Yu.  Procedure(s) Performed: ESOPHAGOGASTRODUODENOSCOPY (EGD) WITH PROPOFOL ESOPHAGEAL DILATION (Mouth)  Patient location during evaluation: PACU Anesthesia Type: General Level of consciousness: awake and alert Pain management: pain level controlled Vital Signs Assessment: post-procedure vital signs reviewed and stable Respiratory status: spontaneous breathing, nonlabored ventilation, respiratory function stable and patient connected to nasal cannula oxygen Cardiovascular status: blood pressure returned to baseline and stable Postop Assessment: no apparent nausea or vomiting Anesthetic complications: no   No notable events documented.   Last Vitals:  Vitals:   01/30/23 0932 01/30/23 0938  BP: 105/72 100/78  Pulse: (!) 56 (!) 55  Resp: 14 13  Temp:    SpO2: 94% 95%    Last Pain:  Vitals:   01/30/23 0920  TempSrc: Temporal  PainSc: 0-No pain                 Lizmary Nader C Anhthu Perdew

## 2023-01-31 ENCOUNTER — Encounter: Payer: Self-pay | Admitting: Gastroenterology

## 2023-02-03 ENCOUNTER — Other Ambulatory Visit: Payer: Self-pay

## 2023-02-03 MED ORDER — PANTOPRAZOLE SODIUM 40 MG PO TBEC: 40.0000 mg | DELAYED_RELEASE_TABLET | Freq: Two times a day (BID) | ORAL | 2 refills | Status: DC

## 2023-02-03 NOTE — Telephone Encounter (Signed)
PA Case ID #: [id number] Rx #: [id number] Need Help? Call us at [phone] Outcome Approved today by [facility] NCPDP 2017 Your PA request has been approved. Additional information will be provided in the approval communication. (Message 1145) Authorization Expiration Date: 02/03/2024

## 2023-04-05 ENCOUNTER — Ambulatory Visit (INDEPENDENT_AMBULATORY_CARE_PROVIDER_SITE_OTHER): Payer: Federal, State, Local not specified - PPO

## 2023-04-05 ENCOUNTER — Encounter: Payer: Self-pay | Admitting: *Deleted

## 2023-04-05 ENCOUNTER — Ambulatory Visit
Admission: EM | Admit: 2023-04-05 | Discharge: 2023-04-05 | Disposition: A | Discharge status: Home / Self Care | Payer: Federal, State, Local not specified - PPO | Attending: Emergency Medicine | Admitting: Emergency Medicine

## 2023-04-05 ENCOUNTER — Other Ambulatory Visit: Payer: Federal, State, Local not specified - PPO

## 2023-04-05 DIAGNOSIS — M546 Pain in thoracic spine: Secondary | ICD-10-CM

## 2023-04-05 DIAGNOSIS — M62830 Muscle spasm of back: Secondary | ICD-10-CM

## 2023-04-05 DIAGNOSIS — W19XXXA Unspecified fall, initial encounter: Secondary | ICD-10-CM

## 2023-04-05 MED ORDER — CYCLOBENZAPRINE HCL 5 MG PO TABS: 5.0000 mg | ORAL_TABLET | Freq: Three times a day (TID) | ORAL | 0 refills | Status: AC | PRN

## 2023-04-05 NOTE — ED Provider Notes (Signed)
 Renaldo Fiddler    CSN: [id number] Arrival date & time: 04/05/23  7564      History   Chief Complaint Chief Complaint  Patient presents with   Fall    HPI Erik Yu. is a 58 y.o. male.   58 year old male pt, Erik Yu, presents to urgent care for evaluation of low back pain x 2 days after falling on log striking posterior ribs. Pt took tylenol yesterday, nothing since. Pt denies head strike or LOC.   The history is provided by the patient. No language interpreter was used.    Past Medical History:  Diagnosis Date   Kidney stone     Patient Active Problem List   Diagnosis Date Noted   Acute right-sided thoracic back pain 04/05/2023   Fall 04/05/2023   Spasm of thoracic back muscle 04/05/2023   Special screening for malignant neoplasms, colon 01/02/2023   Polyp of transverse colon 01/02/2023   Problems with swallowing and mastication 01/02/2023   Stricture and stenosis of esophagus 01/02/2023    Past Surgical History:  Procedure Laterality Date   COLONOSCOPY  2012   COLONOSCOPY WITH PROPOFOL N/A 01/02/2023   Procedure: COLONOSCOPY WITH PROPOFOL WITH POLYPECTOMY;  Surgeon: [name], MD;  Location: [facility];  Service: Endoscopy;  Laterality: N/A;   ESOPHAGEAL DILATION  01/30/2023   Procedure: ESOPHAGEAL DILATION;  Surgeon: [name], MD;  Location: [facility];  Service: Endoscopy;;  12-15 mm   ESOPHAGOGASTRODUODENOSCOPY (EGD) WITH PROPOFOL N/A 01/02/2023   Procedure: ESOPHAGOGASTRODUODENOSCOPY (EGD) WITH PROPOFOL with balloon dilation;  Surgeon: [name], MD;  Location: [facility];  Service: Endoscopy;  Laterality: N/A;  10-12 balloon   ESOPHAGOGASTRODUODENOSCOPY (EGD) WITH PROPOFOL N/A 01/30/2023   Procedure: ESOPHAGOGASTRODUODENOSCOPY (EGD) WITH PROPOFOL;  Surgeon: [name], MD;  Location: [facility];  Service: Endoscopy;  Laterality: N/A;   EXTRACORPOREAL SHOCK WAVE LITHOTRIPSY Left  03/13/2016   Procedure: EXTRACORPOREAL SHOCK WAVE LITHOTRIPSY (ESWL);  Surgeon: [name], MD;  Location: [facility];  Service: Urology;  Laterality: Left;   HERNIA REPAIR Right 2002   Dr [name]       Home Medications    Prior to Admission medications   Medication Sig Start Date End Date Taking? Authorizing Provider  cyclobenzaprine (FLEXERIL) 5 MG tablet Take 1 tablet (5 mg total) by mouth 3 (three) times daily as needed for up to 5 days for muscle spasms. 04/05/23 04/10/23 Yes Jasnoor Trussell, Para March, NP  pantoprazole (PROTONIX) 40 MG tablet Take 1 tablet (40 mg total) by mouth 2 (two) times daily. 02/03/23 02/03/24  [name], MD    Family History Family History  Problem Relation Age of Onset   Cancer Mother        breast/bone   Prostate cancer Neg Hx    Kidney cancer Neg Hx    Sickle cell anemia Neg Hx    Bladder Cancer Neg Hx     Social History Social History   Tobacco Use   Smoking status: Never   Smokeless tobacco: Never  Vaping Use   Vaping status: Never Used  Substance Use Topics   Alcohol use: No    Comment: rare- a few times per year   Drug use: No     Allergies   Shrimp (diagnostic)   Review of Systems Review of Systems  Constitutional:  Negative for fever.  Musculoskeletal:  Positive for back pain and myalgias.  All other systems reviewed and are negative.    Physical Exam  Triage Vital Signs ED Triage Vitals  Encounter Vitals Group     BP      Systolic BP Percentile      Diastolic BP Percentile      Pulse      Resp      Temp      Temp src      SpO2      Weight      Height      Head Circumference      Peak Flow      Pain Score      Pain Loc      Pain Education      Exclude from Growth Chart    No data found.  Updated Vital Signs BP 113/78 (BP Location: Left Arm)   Pulse 74   Temp 98.5 F (36.9 C) (Oral)   Resp 18   Ht 5\' 9"  (1.753 m)   Wt 165 lb (74.8 kg)   SpO2 97%   BMI 24.37 kg/m   Visual Acuity Right Eye  Distance:   Left Eye Distance:   Bilateral Distance:    Right Eye Near:   Left Eye Near:    Bilateral Near:     Physical Exam Vitals and nursing note reviewed.  Constitutional:      Appearance: Normal appearance. He is well-developed and well-groomed.  HENT:     Head: Normocephalic.  Cardiovascular:     Rate and Rhythm: Normal rate and regular rhythm.     Heart sounds: Normal heart sounds.  Pulmonary:     Effort: Pulmonary effort is normal.     Breath sounds: Normal breath sounds and air entry.  Musculoskeletal:     Thoracic back: Spasms, tenderness and bony tenderness present. No swelling, edema, deformity, signs of trauma or lacerations. Normal range of motion. No scoliosis.       Back:  Neurological:     General: No focal deficit present.     Mental Status: He is alert and oriented to person, place, and time.     GCS: GCS eye subscore is 4. GCS verbal subscore is 5. GCS motor subscore is 6.     Cranial Nerves: No cranial nerve deficit.     Sensory: No sensory deficit.  Psychiatric:        Attention and Perception: Attention normal.        Mood and Affect: Mood normal.        Speech: Speech normal.        Behavior: Behavior normal. Behavior is cooperative.      UC Treatments / Results  Labs (all labs ordered are listed, but only abnormal results are displayed) Labs Reviewed - No data to display  EKG   Radiology DG Ribs Unilateral W/Chest Right Result Date: 04/05/2023 CLINICAL DATA:  Fall with posterior rib pain. EXAM: RIGHT RIBS AND CHEST - 3+ VIEW COMPARISON:  None Available. FINDINGS: No fracture or other bone lesions are seen involving the ribs. There is no evidence of pneumothorax or pleural effusion. There is minimal left basilar atelectasis. The right lung is clear. No pleural effusion or pneumothorax. Heart size and mediastinal contours are within normal limits. IMPRESSION: 1. No rib fracture. 2. Minimal left basilar atelectasis. Electronically Signed   By:  Romona Curls M.D.   On: 04/05/2023 09:34    Procedures Procedures (including critical care time)  Medications Ordered in UC Medications - No data to display  Initial Impression / Assessment and Plan / UC Course  I have reviewed the triage vital signs and the nursing notes.  Pertinent labs & imaging results that were available during my care of the patient were reviewed by me and considered in my medical decision making (see chart for details).  Clinical Course as of 04/05/23 6962  Wynelle Link Apr 05, 2023  0918 Wet read negative, will treat with muscle relaxer, otc meds, incentive spirometer given with instructions, follow up with PCP. [JD]    Clinical Course User Index [JD] Lilia Letterman, Para March, NP   Discussed exam findings and plan of care with patient, strict go to ER precautions given.   Patient verbalized understanding to this provider.  Ddx: Acute right sided thoracic back pain, fall, spasm Final Clinical Impressions(s) / UC Diagnoses   Final diagnoses:  Acute right-sided thoracic back pain  Fall, initial encounter  Spasm of thoracic back muscle     Discharge Instructions      No fracture seen, check my chart for official read.  Rest,ice to back 20 min 3 x daily, may switch to heat.  Flexeril for muscle spasm of back May alternate tylenol/ibuprofen as label directed,may  also use over the counter biofreeze or lidocaine patch for pain If you develop new or worsening issues(chest pain,palpitations, or SOB, go to ER for further evaluation).      ED Prescriptions     Medication Sig Dispense Auth. Provider   cyclobenzaprine (FLEXERIL) 5 MG tablet Take 1 tablet (5 mg total) by mouth 3 (three) times daily as needed for up to 5 days for muscle spasms. 15 tablet Shion Bluestein, Para March, NP      PDMP not reviewed this encounter.   Clancy Gourd, NP 04/05/23 [phone]

## 2023-04-05 NOTE — ED Notes (Signed)
 RN instructed patient on incentive spirometer use and patient return demonstrated with understanding

## 2023-04-05 NOTE — ED Triage Notes (Signed)
 Patient states he was hunting Friday and stepped on a log about hip height and then fell backwards on the log hitting back.  Taking OTC Tylenol

## 2023-04-05 NOTE — Discharge Instructions (Addendum)
 No fracture seen, check my chart for official read.  Rest,ice to back 20 min 3 x daily, may switch to heat.  Flexeril for muscle spasm of back May alternate tylenol/ibuprofen as label directed,may  also use over the counter biofreeze or lidocaine patch for pain If you develop new or worsening issues(chest pain,palpitations, or SOB, go to ER for further evaluation).

## 2024-01-12 ENCOUNTER — Telehealth: Payer: Self-pay

## 2024-01-12 MED ORDER — PANTOPRAZOLE SODIUM 40 MG PO TBEC: 40.0000 mg | DELAYED_RELEASE_TABLET | Freq: Two times a day (BID) | ORAL | 0 refills | Status: DC

## 2024-01-12 NOTE — Telephone Encounter (Signed)
 Phone: [phone] Fax: [phone] Date: 01/12/2024 To: Dr. Jinny Fax #: [phone] From: [facility] Re: Erik Yu, 1965/09/02, [address] [place], [place] [address]  This is to inform you that your Prior Authorization request for the above member's Pantoprazole  Sodium 40MG  OR TBEC has been approved. If you are changing the member's therapy the previously approved therapy will be canceled and replaced.  The authorization is valid from 12/13/2023 through 01/11/2025. A letter of explanation will also be mailed to the patient.

## 2024-02-04 ENCOUNTER — Telehealth: Payer: Self-pay

## 2024-02-04 NOTE — Telephone Encounter (Signed)
 Patient called to schedule an appointment for a medication refill. Andrea provided the patient with a one-time refill for January and informed him that he must establish care with a new provider for continued medication management. Patient stated availability is limited to Fridays only. Appointment scheduled with Edwards on 03/18/2024 at 8:30 AM.

## 2024-03-11 ENCOUNTER — Other Ambulatory Visit: Payer: Self-pay

## 2024-03-16 NOTE — Progress Notes (Signed)
 "   03/18/2024 Erik Yu [id number] 09-19-1965  Gastroenterology Office Note    Referring Provider: No ref. provider found Primary Care Physician:  Patient, No Pcp Per  Primary GI Provider: Celestia Rima, NP; Jinny Carmine, MD    Chief Complaint   Chief Complaint  Patient presents with   New Patient (Initial Visit)    Refill medication Pantoprazole      History of Present Illness   Erik Yu. is a 59 y.o. male with PMHx of dysphagia presenting today due to medication refill.  Discussed the use of AI scribe software for clinical note transcription with the patient, who gave verbal consent to proceed.  Patient with history of LA grade C reflux and esophageal irritation, previously managed with twice daily Protonix  since December 2024. Underwent two upper endoscopies with esophageal dilation in November and December 2024 for significant dysphagia. Prior to treatment, experienced difficulty swallowing, requiring finely chewed food and liquids to aid passage. Since initiation of Protonix  and dilation, no further dysphagia or swallowing issues reported.  No current symptoms of reflux, regurgitation, nausea, or vomiting. Previously, only occasional heartburn and prominent swallowing difficulties were noted before treatment. Currently denies any swallowing problems.  Describes altered bowel habits since starting Protonix , with loose stools every morning and night, described as almost like diarrhea, but not quite that bad. Prior to Protonix , bowel movements occurred approximately once every two days. Dietary intake includes limited fruits and vegetables (apples, green beans, salads) and avoids broccoli. Fluid intake is approximately twelve ounces of water daily, along with tea.  01/30/2023 EGD - Small hiatal hernia.  - Benign-appearing esophageal stenosis. Dilated.  - Normal stomach. - Normal examined duodenum.  - No specimens collected  01/02/2023 colonoscopy  and EGD - One 4 mm polyp in the sigmoid colon, removed with a cold snare. Resected and retrieved.  - One 4 mm polyp in the transverse colon, removed with a cold snare. Resected and retrieved.  - Non-bleeding internal hemorrhoids. - repeat in 7 years  - Medium-sized hiatal hernia.  - LA Grade C reflux esophagitis with no bleeding.  - Esophageal stenosis. Dilated. Biopsied. - Normal stomach, Normal examined duodenum. -  PPI BID. repeat in 3 weeks  Patient seen by Dr. Jinny on 12/10/2022 for management of dysphagia.   Past Medical History:  Diagnosis Date   Kidney stone     Past Surgical History:  Procedure Laterality Date   COLONOSCOPY  2012   COLONOSCOPY WITH PROPOFOL  N/A 01/02/2023   Procedure: COLONOSCOPY WITH PROPOFOL  WITH POLYPECTOMY;  Surgeon: Jinny Carmine, MD;  Location: [facility];  Service: Endoscopy;  Laterality: N/A;   ESOPHAGEAL DILATION  01/30/2023   Procedure: ESOPHAGEAL DILATION;  Surgeon: Jinny Carmine, MD;  Location: [facility];  Service: Endoscopy;;  12-15 mm   ESOPHAGOGASTRODUODENOSCOPY (EGD) WITH PROPOFOL  N/A 01/02/2023   Procedure: ESOPHAGOGASTRODUODENOSCOPY (EGD) WITH PROPOFOL  with balloon dilation;  Surgeon: Jinny Carmine, MD;  Location: [facility];  Service: Endoscopy;  Laterality: N/A;  10-12 balloon   ESOPHAGOGASTRODUODENOSCOPY (EGD) WITH PROPOFOL  N/A 01/30/2023   Procedure: ESOPHAGOGASTRODUODENOSCOPY (EGD) WITH PROPOFOL ;  Surgeon: Jinny Carmine, MD;  Location: [facility];  Service: Endoscopy;  Laterality: N/A;   EXTRACORPOREAL SHOCK WAVE LITHOTRIPSY Left 03/13/2016   Procedure: EXTRACORPOREAL SHOCK WAVE LITHOTRIPSY (ESWL);  Surgeon: Rosina Riis, MD;  Location: [facility];  Service: Urology;  Laterality: Left;   HERNIA REPAIR Right 2002   Dr Dellie    Current Outpatient Medications  Medication Sig Dispense Refill  tadalafil (CIALIS) 5 MG tablet Take 5 mg by mouth daily as needed.     pantoprazole  (PROTONIX ) 40 MG tablet  Take 1 tablet (40 mg total) by mouth daily. 90 tablet 3   No current facility-administered medications for this visit.    Allergies as of 03/18/2024 - Review Complete 03/18/2024  Allergen Reaction Noted   Shrimp (diagnostic) Nausea And Vomiting 12/24/2022    Family History  Problem Relation Age of Onset   Cancer Mother        breast/bone   Prostate cancer Neg Hx    Kidney cancer Neg Hx    Sickle cell anemia Neg Hx    Bladder Cancer Neg Hx     Social History   Socioeconomic History   Marital status: Married    Spouse name: Not on file   Number of children: Not on file   Years of education: Not on file   Highest education level: Not on file  Occupational History   Not on file  Tobacco Use   Smoking status: Never   Smokeless tobacco: Never  Vaping Use   Vaping status: Never Used  Substance and Sexual Activity   Alcohol use: No    Comment: rare- a few times per year   Drug use: No   Sexual activity: Not on file  Other Topics Concern   Not on file  Social History Narrative   Not on file   Social Drivers of Health   Tobacco Use: Low Risk  (07/03/2023)   Received from [facility]   Patient History    Smoking Tobacco Use: Never    Smokeless Tobacco Use: Never    Passive Exposure: Not on file  Financial Resource Strain: Low Risk  (07/03/2023)   Received from [facility]   Overall Financial Resource Strain (CARDIA)    Difficulty of Paying Living Expenses: Not hard at all  Food Insecurity: No Food Insecurity (07/03/2023)   Received from [facility]   Epic    Within the past 12 months, you worried that your food would run out before you got the money to buy more.: Never true    Within the past 12 months, the food you bought just didn't last and you didn't have money to get more.: Never true  Transportation Needs: No Transportation Needs (07/03/2023)   Received from [facility] -  Transportation    In the past 12 months, has lack of transportation kept you from medical appointments or from getting medications?: No    Lack of Transportation (Non-Medical): No  Physical Activity: Not on file  Stress: Not on file  Social Connections: Not on file  Intimate Partner Violence: Not on file  Depression (EYV7-0): Not on file  Alcohol Screen: Not on file  Housing: Low Risk  (07/03/2023)   Received from [facility]   Epic    In the last 12 months, was there a time when you were not able to pay the mortgage or rent on time?: No    In the past 12 months, how many times have you moved where you were living?: 0    At any time in the past 12 months, were you homeless or living in a shelter (including now)?: No  Utilities: Not on file  Health Literacy: Not on file     RELEVANT GI HISTORY, IMAGING AND LABS: CBC    Component Value Date/Time   WBC 12.7 (  H) 03/04/2016 0507   RBC 4.75 03/04/2016 0507   HGB 15.2 03/04/2016 0507   HCT 43.1 03/04/2016 0507   PLT 225 03/04/2016 0507   MCV 90.7 03/04/2016 0507   MCH 32.0 03/04/2016 0507   MCHC 35.3 03/04/2016 0507   RDW 12.7 03/04/2016 0507   No results for input(s): HGB in the last 8760 hours.  CMP     Component Value Date/Time   NA 141 03/04/2016 0507   K 3.5 03/04/2016 0507   CL 104 03/04/2016 0507   CO2 27 03/04/2016 0507   GLUCOSE 174 (H) 03/04/2016 0507   BUN 27 (H) 03/04/2016 0507   CREATININE 1.48 (H) 03/04/2016 0507   CALCIUM 9.4 03/04/2016 0507   PROT 7.4 03/04/2016 0507   ALBUMIN 4.4 03/04/2016 0507   AST 26 03/04/2016 0507   ALT 19 03/04/2016 0507   ALKPHOS 67 03/04/2016 0507   BILITOT 0.8 03/04/2016 0507   GFRNONAA 53 (L) 03/04/2016 0507   GFRAA >60 03/04/2016 0507      Latest Ref Rng & Units 03/04/2016    5:07 AM  Hepatic Function  Total Protein 6.5 - 8.1 g/dL 7.4   Albumin 3.5 - 5.0 g/dL 4.4   AST 15 - 41 U/L 26   ALT 17 - 63 U/L 19   Alk Phosphatase 38 - 126 U/L 67   Total  Bilirubin 0.3 - 1.2 mg/dL 0.8       Review of Systems   All systems reviewed and negative except where noted in HPI.    Physical Exam  BP 124/83   Pulse 61   Temp 98.3 F (36.8 C)   Ht 5' 10 (1.778 m)   Wt 169 lb 3.2 oz (76.7 kg)   SpO2 96%   BMI 24.28 kg/m  No LMP for male patient. General:   Alert and oriented. Pleasant and cooperative. Well-nourished and well-developed. In no acute distress.  Head:  Normocephalic and atraumatic. Eyes:  Without icterus Ears:  Normal auditory acuity. Lungs:  Respirations even and unlabored.  Clear throughout to auscultation.   No wheezes, crackles, or rhonchi. No acute distress. Heart:  Regular rate and rhythm; no murmurs, clicks, rubs, or gallops. Abdomen:  Normal bowel sounds.  No bruits.  Soft, non-tender and non-distended without masses, hepatosplenomegaly or hernias noted.  No guarding or rebound tenderness. Rectal:  Deferred. Msk:  Symmetrical without gross deformities. Normal posture. Extremities:  Without edema. Neurologic:  Alert and  oriented x4;  grossly normal neurologically. Skin:  Intact without significant lesions or rashes. Psych:  Alert and cooperative. Normal mood and affect.   Assessment & Plan   Ervie Mccard. is a 59 y.o. male presenting today for annual follow up and medication refill.   Gastroesophageal reflux disease with esophagitis. Also history of esophageal stenosis, status post dilation Nov and Dec 2024.  Chronic condition now well-controlled and asymptomatic. No active reflux or esophagitis.  - Reduced Protonix  to once daily. Refill sent in to pharmacy. - Advised to report recurrence of reflux or dysphagia for possible further evaluation including repeat upper endoscopy.  Loose stools. increased frequency since being on PPI twice daily. Low fiber diet. No alarm symptoms. - Recommended starting Benefiber to bulk stool, emphasizing adequate hydration to prevent constipation. - Provided dietary  counseling to increase intake of fruits, vegetables, and fiber.   Follow up in 1 year or sooner if needed for GI issues.   Grayce Bohr, DNP, AGNP-C [facility]  "

## 2024-03-18 ENCOUNTER — Ambulatory Visit: Admitting: Family Medicine

## 2024-03-18 VITALS — BP 124/83 | HR 61 | Temp 98.3°F | Ht 70.0 in | Wt 169.2 lb

## 2024-03-18 DIAGNOSIS — R195 Other fecal abnormalities: Secondary | ICD-10-CM

## 2024-03-18 DIAGNOSIS — R1319 Other dysphagia: Secondary | ICD-10-CM

## 2024-03-18 DIAGNOSIS — K2100 Gastro-esophageal reflux disease with esophagitis, without bleeding: Secondary | ICD-10-CM | POA: Diagnosis not present

## 2024-03-18 MED ORDER — PANTOPRAZOLE SODIUM 40 MG PO TBEC: 40.0000 mg | DELAYED_RELEASE_TABLET | Freq: Every day | ORAL | 3 refills | Status: AC

## 2025-03-17 ENCOUNTER — Ambulatory Visit: Admitting: Family Medicine
